# Patient Record
Sex: Female | Born: 1965 | Race: White | Hispanic: No | Marital: Married | Smoking: Never smoker
Health system: Southern US, Community
[De-identification: ages and names within clinical notes are randomized; demographics above are authoritative.]

## PROBLEM LIST (undated history)

## (undated) DIAGNOSIS — C73 Malignant neoplasm of thyroid gland: Secondary | ICD-10-CM

## (undated) DIAGNOSIS — R51 Headache: Secondary | ICD-10-CM

## (undated) DIAGNOSIS — Z9889 Other specified postprocedural states: Secondary | ICD-10-CM

## (undated) DIAGNOSIS — R232 Flushing: Secondary | ICD-10-CM

## (undated) DIAGNOSIS — B019 Varicella without complication: Secondary | ICD-10-CM

## (undated) DIAGNOSIS — E785 Hyperlipidemia, unspecified: Secondary | ICD-10-CM

## (undated) DIAGNOSIS — Z90710 Acquired absence of both cervix and uterus: Secondary | ICD-10-CM

## (undated) DIAGNOSIS — I10 Essential (primary) hypertension: Secondary | ICD-10-CM

## (undated) DIAGNOSIS — E039 Hypothyroidism, unspecified: Secondary | ICD-10-CM

## (undated) DIAGNOSIS — D649 Anemia, unspecified: Secondary | ICD-10-CM

## (undated) DIAGNOSIS — R519 Headache, unspecified: Secondary | ICD-10-CM

## (undated) DIAGNOSIS — R112 Nausea with vomiting, unspecified: Secondary | ICD-10-CM

## (undated) DIAGNOSIS — N390 Urinary tract infection, site not specified: Secondary | ICD-10-CM

## (undated) HISTORY — DX: Varicella without complication: B01.9

## (undated) HISTORY — DX: Hyperlipidemia, unspecified: E78.5

## (undated) HISTORY — DX: Essential (primary) hypertension: I10

## (undated) HISTORY — DX: Urinary tract infection, site not specified: N39.0

## (undated) HISTORY — DX: Malignant neoplasm of thyroid gland: C73

## (undated) HISTORY — PX: HERNIA REPAIR: SHX51

## (undated) HISTORY — DX: Headache, unspecified: R51.9

## (undated) HISTORY — PX: SCALP REDUCTION: SHX2377

## (undated) HISTORY — PX: COLONOSCOPY: SHX174

## (undated) HISTORY — DX: Headache: R51

---

## 1992-10-27 DIAGNOSIS — C73 Malignant neoplasm of thyroid gland: Secondary | ICD-10-CM

## 1992-10-27 HISTORY — DX: Malignant neoplasm of thyroid gland: C73

## 1993-10-27 HISTORY — PX: THYROIDECTOMY: SHX17

## 2004-10-27 HISTORY — PX: UMBILICAL HERNIA REPAIR: SUR1181

## 2013-09-26 ENCOUNTER — Encounter: Payer: Self-pay | Admitting: Family Medicine

## 2013-09-26 ENCOUNTER — Ambulatory Visit (INDEPENDENT_AMBULATORY_CARE_PROVIDER_SITE_OTHER): Payer: BC Managed Care – PPO | Admitting: Family Medicine

## 2013-09-26 VITALS — BP 122/86 | Temp 98.1°F | Ht 67.0 in | Wt 150.0 lb

## 2013-09-26 DIAGNOSIS — Z7689 Persons encountering health services in other specified circumstances: Secondary | ICD-10-CM

## 2013-09-26 DIAGNOSIS — Z85850 Personal history of malignant neoplasm of thyroid: Secondary | ICD-10-CM | POA: Insufficient documentation

## 2013-09-26 DIAGNOSIS — Z7189 Other specified counseling: Secondary | ICD-10-CM

## 2013-09-26 DIAGNOSIS — E039 Hypothyroidism, unspecified: Secondary | ICD-10-CM

## 2013-09-26 DIAGNOSIS — Z23 Encounter for immunization: Secondary | ICD-10-CM

## 2013-09-26 NOTE — Patient Instructions (Signed)
-  PLEASE SIGN UP FOR MYCHART TODAY   We recommend the following healthy lifestyle measures: - eat a healthy diet consisting of lots of vegetables, fruits, beans, nuts, seeds, healthy meats such as white chicken and fish and whole grains.  - avoid fried foods, fast food, processed foods, sodas, red meet and other fattening foods.  - get a least 150 minutes of aerobic exercise per week.   Follow up in: 4-6 months or at your convenience for yearly physical

## 2013-09-26 NOTE — Progress Notes (Signed)
Chief Complaint  Patient presents with  . Establish Care    HPI:  Chelsea Frye is here to establish care. Recently moved here. Last PCP and physical: physical march utd on paps and mammos  Has the following chronic problems and concerns today:  Hx of Thyroid Cancer: -has always been followed by an endocrinologist and wants referral to endocrinologist ere -her endocrinologist advised Dr. Jilda Panda  Patient Active Problem List   Diagnosis Date Noted  . Unspecified hypothyroidism 09/26/2013  . History of thyroid cancer 09/26/2013    Health Maintenance: -tdap today -utd on flu  ROS: See pertinent positives and negatives per HPI.  Past Medical History  Diagnosis Date  . Thyroid cancer   . Chicken pox   . Frequent headaches   . UTI (urinary tract infection)     Family History  Problem Relation Age of Onset  . Hyperlipidemia Father   . Hypertension Mother   . Hyperlipidemia Brother   . Hypertension Brother   . Diabetes Maternal Grandmother   . Diabetes Maternal Aunt     History   Social History  . Marital Status: Married    Spouse Name: N/A    Number of Children: N/A  . Years of Education: N/A   Social History Main Topics  . Smoking status: Never Smoker   . Smokeless tobacco: None  . Alcohol Use: Yes     Comment: 1 to 2 glasses of wine weekly   . Drug Use: None  . Sexual Activity: None   Other Topics Concern  . 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  GENERAL: vitals reviewed and listed above, alert, oriented, appears well hydrated and in no acute distress  MS: moves all extremities without noticeable abnormality  PSYCH: pleasant and cooperative, no obvious depression or anxiety  ASSESSMENT AND PLAN:  Discussed the following assessment and plan:  Need for prophylactic vaccination with combined diphtheria-tetanus-pertussis (DTP) vaccine - Plan: Tdap vaccine greater than or equal to 7yo IM  History of thyroid cancer - Plan: Ambulatory referral to Endocrinology, CANCELED: Ambulatory referral to Endocrinology  Unspecified hypothyroidism - Plan: Ambulatory referral to Endocrinology, CANCELED: Ambulatory referral to Endocrinology  Encounter to establish care -We reviewed the PMH, PSH, FH, SH, Meds and Allergies. -We provided refills for any medications we will prescribe as needed. -We addressed current concerns per orders and patient instructions. -We have asked for records for pertinent exams, studies, vaccines and notes from previous providers. -We have advised patient to follow up per instructions below.   -Patient advised to return or notify a doctor immediately if symptoms worsen or persist or new concerns arise.  Patient Instructions  -PLEASE SIGN UP FOR MYCHART TODAY   We recommend the following healthy lifestyle measures: - eat a healthy diet consisting of lots of vegetables, fruits, beans, nuts, seeds, healthy meats such as white  chicken and fish and whole grains.  - avoid fried foods, fast food, processed foods, sodas, red meet and other fattening foods.  - get a least 150 minutes of aerobic exercise per week.   Follow up in: 4-6 months or at your convenience for yearly physical      Terressa Koyanagi.

## 2013-10-03 ENCOUNTER — Encounter: Payer: Self-pay | Admitting: Family Medicine

## 2013-12-23 NOTE — Progress Notes (Signed)
Received office notes from Dr. Elyse Hsu from visit on 12/19/13. Pt to continue Synthroid 200 mg and future lab date.  Yearly follow up.

## 2014-01-20 ENCOUNTER — Telehealth: Payer: Self-pay

## 2014-01-20 NOTE — Telephone Encounter (Signed)
Called and spoke with pt and pt states it started 1 year ago and it is starting to affect her job.  It has gotten worse over time.  Pt states she speaks a lot and it is causing a problem.  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  Pt is aware of appt on 4/1 at 8:45 am.

## 2014-01-20 NOTE — Telephone Encounter (Signed)
Received a request from patient requesting an appointment for increased social anxiety- shaky hand, red face, racing heartbeat.  Called pt to get more information.

## 2014-01-25 ENCOUNTER — Encounter: Payer: Self-pay | Admitting: Family Medicine

## 2014-01-25 ENCOUNTER — Ambulatory Visit (INDEPENDENT_AMBULATORY_CARE_PROVIDER_SITE_OTHER): Payer: BC Managed Care – PPO | Admitting: Family Medicine

## 2014-01-25 VITALS — BP 122/90 | Temp 98.7°F | Wt 152.0 lb

## 2014-01-25 DIAGNOSIS — R232 Flushing: Secondary | ICD-10-CM

## 2014-01-25 DIAGNOSIS — N951 Menopausal and female climacteric states: Secondary | ICD-10-CM

## 2014-01-25 DIAGNOSIS — F4010 Social phobia, unspecified: Secondary | ICD-10-CM

## 2014-01-25 MED ORDER — PAROXETINE HCL 20 MG PO TABS: 20.0000 mg | ORAL_TABLET | Freq: Every day | ORAL | Status: DC

## 2014-01-25 NOTE — Progress Notes (Signed)
Chief Complaint  Patient presents with  . social anxiety    HPI:  Anxiety: -social anxiety - worsening -gets anxious, butterflies in stomach, racing heat, sweating in social areas in conferences and work speaking  Hot flashes: -periods still regular but starting to have hot flashes -sees gyn and endocrine  ROS: See pertinent positives and negatives per HPI.  Past Medical History  Diagnosis Date  . Thyroid cancer   . Chicken pox   . Frequent headaches   . UTI (urinary tract infection)     Past Surgical History  Procedure Laterality Date  . Thyroidectomy  1995    Family History  Problem Relation Age of Onset  . Hyperlipidemia Father   . Hypertension Mother   . Hyperlipidemia Brother   . Hypertension Brother   . Diabetes Maternal Grandmother   . Diabetes Maternal Aunt     History   Social History  . Marital Status: Married    Spouse Name: N/A    Number of Children: N/A  . Years of Education: N/A   Social History Main Topics  . Smoking status: Never Smoker   . Smokeless tobacco: None  . Alcohol Use: Yes     Comment: 1 to 2 glasses of wine weekly   . Drug Use: None  . Sexual Activity: None   Other Topics Concern  . 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  GENERAL: vitals reviewed and listed above, alert, oriented, appears well hydrated and in no acute  distress  HEENT: atraumatic, conjunttiva clear, no obvious abnormalities on inspection of external nose and ears  NECK: no obvious masses on inspection  LUNGS: clear to auscultation bilaterally, no wheezes, rales or rhonchi, good air movement  CV: HRRR, no peripheral edema  MS: moves all extremities without noticeable abnormality  PSYCH: pleasant and cooperative, no obvious depression or anxiety  ASSESSMENT AND PLAN:  Discussed the following assessment and plan:  Social anxiety disorder - Plan: PARoxetine (PAXIL) 20 MG tablet  Hot flashes - Plan: PARoxetine (PAXIL) 20 MG tablet  Perimenopause - Plan: PARoxetine (PAXIL) 20 MG tablet  -discussed options and opted to try paroxetine after discussion risks/benefits -advised counseling -advised follow up 4-6 months -Patient advised to return or notify a doctor immediately if symptoms worsen or persist or new concerns arise.  Patient Instructions  -start the paroxtine daily  -consider counseling  -follow up in 1 month       Tyre Beaver R.

## 2014-01-25 NOTE — Progress Notes (Signed)
Pre visit review using our clinic review tool, if applicable. No additional management support is needed unless otherwise documented below in the visit note. 

## 2014-01-25 NOTE — Patient Instructions (Signed)
-  start the paroxtine daily  -consider counseling  -follow up in 1 month

## 2014-02-13 LAB — HM PAP SMEAR: HPV, high-risk: NEGATIVE

## 2014-03-07 ENCOUNTER — Ambulatory Visit: Payer: BC Managed Care – PPO | Admitting: Family Medicine

## 2014-03-16 ENCOUNTER — Ambulatory Visit (INDEPENDENT_AMBULATORY_CARE_PROVIDER_SITE_OTHER): Payer: BC Managed Care – PPO | Admitting: Family Medicine

## 2014-03-16 ENCOUNTER — Encounter: Payer: Self-pay | Admitting: Family Medicine

## 2014-03-16 VITALS — BP 118/78 | HR 77 | Temp 97.8°F | Ht 67.0 in | Wt 154.0 lb

## 2014-03-16 DIAGNOSIS — N951 Menopausal and female climacteric states: Secondary | ICD-10-CM

## 2014-03-16 DIAGNOSIS — F411 Generalized anxiety disorder: Secondary | ICD-10-CM

## 2014-03-16 DIAGNOSIS — R232 Flushing: Secondary | ICD-10-CM

## 2014-03-16 DIAGNOSIS — F4010 Social phobia, unspecified: Secondary | ICD-10-CM

## 2014-03-16 MED ORDER — PAROXETINE HCL 20 MG PO TABS: 20.0000 mg | ORAL_TABLET | Freq: Every day | ORAL | Status: DC

## 2014-03-16 NOTE — Progress Notes (Addendum)
No chief complaint on file.   HPI:  Follow up:  Perimenopause and GAD: -started paxil last visit -reports: she reports she is feeling great and hot flashes resolved -no further hot flashes, thoughts of self harm no hearts racing  Reports saw her endocrinologist recently for thyroid and all normal. ROS: See pertinent positives and negatives per HPI.  Past Medical History  Diagnosis Date  . Thyroid cancer   . Chicken pox   . Frequent headaches   . UTI (urinary tract infection)     Past Surgical History  Procedure Laterality Date  . Thyroidectomy  1995    Family History  Problem Relation Age of Onset  . Hyperlipidemia Father   . Hypertension Mother   . Hyperlipidemia Brother   . Hypertension Brother   . Diabetes Maternal Grandmother   . Diabetes Maternal Aunt     History   Social History  . Marital Status: Married    Spouse Name: N/A    Number of Children: N/A  . Years of Education: N/A   Social History Main Topics  . Smoking status: Never Smoker   . Smokeless tobacco: None  . Alcohol Use: Yes     Comment: 1 to 2 glasses of wine weekly   . Drug Use: None  . Sexual Activity: None   Other Topics Concern  . 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  GENERAL: vitals reviewed and listed above, alert, oriented, appears well hydrated and in no acute  distress  HEENT: atraumatic, conjunttiva clear, no obvious abnormalities on inspection of external nose and ears  NECK: no obvious masses on inspection  MS: moves all extremities without noticeable abnormality  PSYCH: pleasant and cooperative, no obvious depression or anxiety  ASSESSMENT AND PLAN:  Discussed the following assessment and plan:  GAD (generalized anxiety disorder)  Social anxiety disorder - Plan: PARoxetine (PAXIL) 20 MG tablet  Hot flashes - Plan: PARoxetine (PAXIL) 20 MG tablet  Perimenopause - Plan: PARoxetine (PAXIL) 20 MG tablet  -Patient advised to return or notify a doctor immediately if symptoms worsen or persist or new concerns arise.  There are no Patient Instructions on file for this visit.   Lucretia Kern

## 2014-03-16 NOTE — Progress Notes (Signed)
Pre visit review using our clinic review tool, if applicable. No additional management support is needed unless otherwise documented below in the visit note. 

## 2014-09-15 ENCOUNTER — Encounter: Payer: BC Managed Care – PPO | Admitting: Family Medicine

## 2014-09-15 NOTE — Progress Notes (Signed)
Error   This encounter was created in error - please disregard. 

## 2014-10-27 HISTORY — PX: INGUINAL HERNIA REPAIR: SUR1180

## 2015-01-24 ENCOUNTER — Other Ambulatory Visit: Payer: Self-pay | Admitting: Family Medicine

## 2015-04-03 ENCOUNTER — Ambulatory Visit (INDEPENDENT_AMBULATORY_CARE_PROVIDER_SITE_OTHER): Payer: BLUE CROSS/BLUE SHIELD | Admitting: Family Medicine

## 2015-04-03 ENCOUNTER — Encounter: Payer: Self-pay | Admitting: Family Medicine

## 2015-04-03 VITALS — BP 138/82 | HR 96 | Temp 97.9°F | Ht 67.0 in | Wt 165.4 lb

## 2015-04-03 DIAGNOSIS — F411 Generalized anxiety disorder: Secondary | ICD-10-CM

## 2015-04-03 MED ORDER — PAROXETINE HCL 20 MG PO TABS: 20.0000 mg | ORAL_TABLET | Freq: Every day | ORAL | Status: DC

## 2015-04-03 NOTE — Patient Instructions (Addendum)
BEFORE YOUR: -physical in 3 months - come fasting and will do labs

## 2015-04-03 NOTE — Progress Notes (Signed)
  HPI:  Follow up:  GAD/Hot flashes - perimenopausa: -on paxil for this -reports: is doing great and wants to continue her paxil -denies: depression, panic, worsening anxiety -just saw her endocrinologist recently and recently increased her thyroid medication  ROS: See pertinent positives and negatives per HPI.  Past Medical History  Diagnosis Date  . Thyroid cancer   . Chicken pox   . Frequent headaches   . UTI (urinary tract infection)     Past Surgical History  Procedure Laterality Date  . Thyroidectomy  1995    Family History  Problem Relation Age of Onset  . Hyperlipidemia Father   . Hypertension Mother   . Hyperlipidemia Brother   . Hypertension Brother   . Diabetes Maternal Grandmother   . Diabetes Maternal Aunt     History   Social History  . Marital Status: Married    Spouse Name: N/A  . Number of Children: N/A  . Years of Education: N/A   Social History Main Topics  . Smoking status: Never Smoker   . Smokeless tobacco: Not on file  . Alcohol Use: Yes     Comment: 1 to 2 glasses of wine weekly   . Drug Use: Not on file  . Sexual Activity: Not on file   Other Topics Concern  . 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  PARoxetine (PAXIL) 20 MG tablet, Take 1 tablet (20 mg total) by mouth daily., Disp: 90 tablet, Rfl: 1 .  SYNTHROID 200 MCG tablet, 6 a week, Disp: , Rfl:   EXAM:  Filed Vitals:   04/03/15 1601  BP: 138/82  Pulse: 96  Temp: 97.9 F (36.6 C)    Body mass index is 25.9 kg/(m^2).  GENERAL: vitals reviewed and listed above, alert, oriented, appears well hydrated and in no acute  distress  HEENT: atraumatic, conjunttiva clear, no obvious abnormalities on inspection of external nose and ears  NECK: no obvious masses on inspection  LUNGS: clear to auscultation bilaterally, no wheezes, rales or rhonchi, good air movement  CV: HRRR, no peripheral edema  MS: moves all extremities without noticeable abnormality  PSYCH: pleasant and cooperative, no obvious depression or anxiety  ASSESSMENT AND PLAN:  Discussed the following assessment and plan:  GAD (generalized anxiety disorder) - Plan: PARoxetine (PAXIL) 20 MG tablet  -Patient advised to return or notify a doctor immediately if symptoms worsen or persist or new concerns arise.  Patient Instructions  BEFORE YOUR: -physical in 3 months - come fasting and will do labs       KIM, [place]

## 2015-04-03 NOTE — Progress Notes (Signed)
Pre visit review using our clinic review tool, if applicable. No additional management support is needed unless otherwise documented below in the visit note. 

## 2015-07-27 ENCOUNTER — Ambulatory Visit (INDEPENDENT_AMBULATORY_CARE_PROVIDER_SITE_OTHER): Payer: BLUE CROSS/BLUE SHIELD | Admitting: Family Medicine

## 2015-07-27 ENCOUNTER — Encounter: Payer: Self-pay | Admitting: Family Medicine

## 2015-07-27 VITALS — BP 136/96 | HR 104 | Temp 97.4°F | Ht 66.5 in | Wt 166.3 lb

## 2015-07-27 DIAGNOSIS — N951 Menopausal and female climacteric states: Secondary | ICD-10-CM | POA: Diagnosis not present

## 2015-07-27 DIAGNOSIS — Z0000 Encounter for general adult medical examination without abnormal findings: Secondary | ICD-10-CM

## 2015-07-27 DIAGNOSIS — I10 Essential (primary) hypertension: Secondary | ICD-10-CM

## 2015-07-27 DIAGNOSIS — R232 Flushing: Secondary | ICD-10-CM | POA: Insufficient documentation

## 2015-07-27 DIAGNOSIS — F411 Generalized anxiety disorder: Secondary | ICD-10-CM

## 2015-07-27 DIAGNOSIS — E038 Other specified hypothyroidism: Secondary | ICD-10-CM | POA: Diagnosis not present

## 2015-07-27 DIAGNOSIS — Z1211 Encounter for screening for malignant neoplasm of colon: Secondary | ICD-10-CM

## 2015-07-27 LAB — LIPID PANEL
CHOLESTEROL: 213 mg/dL — AB (ref 0–200)
HDL: 72.1 mg/dL (ref >39.00)
LDL CALC: 121 mg/dL — AB (ref 0–99)
NonHDL: 140.83
TRIGLYCERIDES: 97 mg/dL (ref 0.0–149.0)
Total CHOL/HDL Ratio: 3
VLDL: 19.4 mg/dL (ref 0.0–40.0)

## 2015-07-27 LAB — BASIC METABOLIC PANEL
BUN: 16 mg/dL (ref 6–23)
CHLORIDE: 103 meq/L (ref 96–112)
CO2: 28 meq/L (ref 19–32)
CREATININE: 0.81 mg/dL (ref 0.40–1.20)
Calcium: 9.4 mg/dL (ref 8.4–10.5)
GFR: 79.45 mL/min (ref >60.00)
Glucose, Bld: 97 mg/dL (ref 70–99)
Potassium: 4.2 mEq/L (ref 3.5–5.1)
Sodium: 139 mEq/L (ref 135–145)

## 2015-07-27 LAB — HEMOGLOBIN A1C: Hgb A1c MFr Bld: 5.5 % (ref 4.6–6.5)

## 2015-07-27 MED ORDER — HYDROCHLOROTHIAZIDE 25 MG PO TABS: 25.0000 mg | ORAL_TABLET | Freq: Every day | ORAL | Status: DC

## 2015-07-27 NOTE — Patient Instructions (Signed)
BEFORE YOU LEAVE: -labs -schedule follow up visit in 1 month  START the new medication (HCTZ/hydrochlorthiazide) for your blood pressure.  -We have ordered labs or studies at this visit. It can take up to 1-2 weeks for results and processing. We will contact you with instructions IF your results are abnormal. Normal results will be released to your [facility]. If you have not heard from [place] or can not find your results in [facility] in 2 weeks please contact our office.  -We placed a referral for you as discussed for your colonoscopy. It usually takes about 1-2 weeks to process and schedule this referral. If you have not heard from [place] regarding this appointment in 2 weeks please contact our office.  We recommend the following healthy lifestyle measures: - eat a healthy whole foods diet consisting of regular small meals composed of vegetables, fruits, beans, nuts, seeds, healthy meats such as white chicken and fish and whole grains.  - avoid sweets, white starchy foods, fried foods, fast food, processed foods, sodas, red meet and other fattening foods.  - get a least 150-300 minutes of aerobic exercise per week.

## 2015-07-27 NOTE — Progress Notes (Signed)
Pre visit review using our clinic review tool, if applicable. No additional management support is needed unless otherwise documented below in the visit note. 

## 2015-07-27 NOTE — Progress Notes (Signed)
HPI:  Here for CPE:  -Concerns and/or follow up today:   Elevated Blood Pressure: -reports intermittent at doctor visits for several years -strong FH of hypertension -denies: CP, SOB, DOE, HA  -Diet: variety of foods, balance and well rounded, larger portion sizes  -Exercise: no regular exercise  -Taking folic acid, vitamin D or calcium: no  -Diabetes and Dyslipidemia Screening: FASTING today  -Hx of HTN: no  -Vaccines: UTD  -pap history: does with gyn  -sexual activity: yes, female partner, no new partners  -wants STI testing (Hep C if born [birth year]): no  -FH breast, colon or ovarian ca: see FH Last mammogram: does with gyn yearly Last colon cancer screening: due  -Alcohol, Tobacco, drug use: see social history  Review of Systems - no fevers, unintentional weight loss, vision loss, hearing loss, chest pain, sob, hemoptysis, melena, hematochezia, hematuria, genital discharge, changing or concerning skin lesions, bleeding, bruising, loc, thoughts of self harm or SI  Past Medical History  Diagnosis Date  . Thyroid cancer   . Chicken pox   . Frequent headaches   . UTI (urinary tract infection)     Past Surgical History  Procedure Laterality Date  . Thyroidectomy  1995    Family History  Problem Relation Age of Onset  . Hyperlipidemia Father   . Hypertension Mother   . Hyperlipidemia Brother   . Hypertension Brother   . Diabetes Maternal Grandmother   . Diabetes Maternal Aunt     Social History   Social History  . Marital Status: Married    Spouse Name: N/A  . Number of Children: N/A  . Years of Education: N/A   Social History Main Topics  . Smoking status: Never Smoker   . Smokeless tobacco: None  . Alcohol Use: Yes     Comment: 1 to 2 glasses of wine weekly   . Drug Use: None  . Sexual Activity: Not Asked   Other Topics Concern  . 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  PARoxetine (PAXIL) 20 MG tablet, Take 1 tablet (20 mg total) by mouth daily., Disp: 90 tablet, Rfl: 1 .  SYNTHROID 200 MCG tablet, 6 a week, Disp: , Rfl:  .  hydrochlorothiazide (HYDRODIURIL) 25 MG tablet, Take 1 tablet (25 mg total) by mouth daily., Disp: 30 tablet, Rfl: 3  EXAM:  Filed Vitals:   07/27/15 0937  BP: 136/96  Pulse: 104  Temp: 97.4 F (36.3 C)    GENERAL: vitals reviewed and listed below, alert, oriented, appears well hydrated and in no acute distress  HEENT: head atraumatic, PERRLA, normal appearance of eyes, ears, nose and mouth. moist mucus membranes.  NECK: supple, no masses or lymphadenopathy  LUNGS: clear to auscultation bilaterally, no rales, rhonchi or wheeze  CV: HRRR, no peripheral edema or cyanosis, normal pedal pulses  BREAST: normal appearance - no lesions or discharge, on palpation normal breast tissue without any suspicious masses  ABDOMEN: bowel sounds normal, soft, non tender to palpation, no masses, no rebound or guarding  GU: normal appearance  of external genitalia - no lesions or masses, normal vaginal mucosa - no abnormal discharge, normal appearance of cervix - no lesions or abnormal discharge, no masses or tenderness on palpation of uterus and ovaries.  RECTAL: refused  SKIN: no rash or abnormal lesions  MS: normal gait, moves all extremities normally  NEURO: CN II-XII grossly intact, normal muscle strength and sensation to light touch on extremities  PSYCH: normal affect, pleasant and cooperative  ASSESSMENT AND PLAN:  Discussed the following assessment and plan:  Visit for preventive health examination - Plan: Hemoglobin A1c, Lipid Panel  Other specified  hypothyroidism  Hot flashes  GAD (generalized anxiety disorder)  Essential hypertension - Plan: Basic metabolic panel  Colon cancer screening - Plan: Ambulatory referral to Gastroenterology  -BP higher on recheck, discussed and she opted to start diuretic for this. Labs. F/u in 1 month.  -Discussed and advised all [place] preventive services health task force level A and B recommendations for age, sex and risks.  -Advised at least 150 minutes of exercise per week and a healthy diet low in saturated fats and sweets and consisting of fresh fruits and vegetables, lean meats such as fish and white chicken and whole grains.  -FASTING labs, studies and vaccines per orders this encounter  Orders Placed This Encounter  Procedures  . Basic metabolic panel  . Hemoglobin A1c  . Lipid Panel  . Ambulatory referral to Gastroenterology    Referral Priority:  Routine    Referral Type:  Consultation    Referral Reason:  Specialty Services Required    Number of Visits Requested:  1    Patient advised to return to clinic immediately if symptoms worsen or persist or new concerns.  Patient Instructions  BEFORE YOU LEAVE: -labs -schedule follow up visit in 1 month  START the new medication (HCTZ/hydrochlorthiazide) for your blood pressure.  -We have ordered labs or studies at this visit. It can take up to 1-2 weeks for results and processing. We will contact you with instructions IF your results are abnormal. Normal results will be released to your [facility]. If you have not heard from [place] or can not find your results in [facility] in 2 weeks please contact our office.  -We placed a referral for you as discussed for your colonoscopy. It usually takes about 1-2 weeks to process and schedule this referral. If you have not heard from [place] regarding this appointment in 2 weeks please contact our office.  We recommend the following healthy lifestyle measures: - eat a healthy whole foods diet consisting of  regular small meals composed of vegetables, fruits, beans, nuts, seeds, healthy meats such as white chicken and fish and whole grains.  - avoid sweets, white starchy foods, fried foods, fast food, processed foods, sodas, red meet and other fattening foods.  - get a least 150-300 minutes of aerobic exercise per week.       No Follow-up on file.  Colin Benton R.

## 2015-08-27 ENCOUNTER — Ambulatory Visit (INDEPENDENT_AMBULATORY_CARE_PROVIDER_SITE_OTHER): Payer: BLUE CROSS/BLUE SHIELD | Admitting: Family Medicine

## 2015-08-27 ENCOUNTER — Encounter: Payer: Self-pay | Admitting: Family Medicine

## 2015-08-27 VITALS — BP 120/84 | HR 97 | Temp 98.1°F | Ht 66.5 in | Wt 168.1 lb

## 2015-08-27 DIAGNOSIS — I10 Essential (primary) hypertension: Secondary | ICD-10-CM

## 2015-08-27 HISTORY — DX: Essential (primary) hypertension: I10

## 2015-08-27 MED ORDER — HYDROCHLOROTHIAZIDE 25 MG PO TABS: 25.0000 mg | ORAL_TABLET | Freq: Every day | ORAL | Status: DC

## 2015-08-27 NOTE — Patient Instructions (Signed)
BEFORE YOU LEAVE: -schedule follow up in 6 months  We recommend the following healthy lifestyle measures: - eat a healthy whole foods diet consisting of regular small meals composed of vegetables, fruits, beans, nuts, seeds, healthy meats such as white chicken and fish and whole grains.  - avoid sweets, white starchy foods, fried foods, fast food, processed foods, sodas, red meet and other fattening foods.  - get a least 150-300 minutes of aerobic exercise per week.   

## 2015-08-27 NOTE — Progress Notes (Signed)
  HPI:  Follow up:  Hypertension: -started hctz 06/2015 -reports: doing great, tolerating medication well -denies: CP, SOB, DOE   ROS: See pertinent positives and negatives per HPI.  Past Medical History  Diagnosis Date  . Thyroid cancer ([place])     hypothyroidism, followed by endocrine  . Chicken pox   . Frequent headaches   . UTI (urinary tract infection)   . Essential hypertension 08/27/2015    Past Surgical History  Procedure Laterality Date  . Thyroidectomy  1995    Family History  Problem Relation Age of Onset  . Hyperlipidemia Father   . Hypertension Mother   . Hyperlipidemia Brother   . Hypertension Brother   . Diabetes Maternal Grandmother   . Diabetes Maternal Aunt     Social History   Social History  . Marital Status: Married    Spouse Name: N/A  . Number of Children: N/A  . Years of Education: N/A   Social History Main Topics  . Smoking status: Never Smoker   . Smokeless tobacco: None  . Alcohol Use: Yes     Comment: 1 to 2 glasses of wine weekly   . Drug Use: None  . Sexual Activity: Not Asked   Other Topics Concern  . 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  hydrochlorothiazide (HYDRODIURIL) 25 MG tablet, Take 1 tablet (25 mg total) by mouth daily., Disp: 90 tablet, Rfl: 3 .  Multiple Vitamins-Minerals (ONE-A-DAY WOMENS 50 PLUS PO), Take by mouth., Disp: , Rfl:  .  PARoxetine (PAXIL) 20 MG tablet, Take 1 tablet (20 mg total) by mouth daily., Disp: 90 tablet, Rfl: 1 .  SYNTHROID 200 MCG tablet, 6 a week, Disp: , Rfl:   EXAM:  Filed Vitals:   08/27/15 1452  BP: 120/84  Pulse: 97  Temp: 98.1 F (36.7 C)    Body mass index is 26.73 kg/(m^2).  GENERAL: vitals reviewed and listed above,  alert, oriented, appears well hydrated and in no acute distress  HEENT: atraumatic, conjunttiva clear, no obvious abnormalities on inspection of external nose and ears  NECK: no obvious masses on inspection  LUNGS: clear to auscultation bilaterally, no wheezes, rales or rhonchi, good air movement  CV: HRRR, no peripheral edema  MS: moves all extremities without noticeable abnormality  PSYCH: pleasant and cooperative, no obvious depression or anxiety  ASSESSMENT AND PLAN:  Discussed the following assessment and plan:  Essential hypertension  -doing great -continue hctz -reports she has colonoscopy set up -Patient advised to return or notify a doctor immediately if symptoms worsen or persist or new concerns arise.  Patient Instructions  BEFORE YOU LEAVE -schedule follow up in 6 months  We recommend the following healthy lifestyle measures: - eat a healthy whole foods diet consisting of regular small meals composed of vegetables, fruits, beans, nuts, seeds, healthy meats such as white chicken and fish and whole grains.  - avoid sweets, white starchy foods, fried foods, fast food, processed foods, sodas, red meet and other fattening foods.  - get a least 150-300 minutes of aerobic exercise per week.       Colin Benton R.

## 2015-08-27 NOTE — Progress Notes (Signed)
Pre visit review using our clinic review tool, if applicable. No additional management support is needed unless otherwise documented below in the visit note. 

## 2015-10-19 ENCOUNTER — Other Ambulatory Visit: Payer: Self-pay | Admitting: Family Medicine

## 2015-10-19 NOTE — Telephone Encounter (Signed)
Medication requesting refill: Paroxetine (Paxil) 20 mg -- Okay to refill?   Last seen: 08/27/2015 Last refill: 04/03/2015 #90 with 1 refill

## 2015-10-28 HISTORY — PX: COLONOSCOPY: SHX174

## 2015-11-06 ENCOUNTER — Encounter: Payer: Self-pay | Admitting: Internal Medicine

## 2015-12-24 ENCOUNTER — Ambulatory Visit (AMBULATORY_SURGERY_CENTER): Payer: Self-pay | Admitting: *Deleted

## 2015-12-24 VITALS — Ht 67.0 in | Wt 167.0 lb

## 2015-12-24 DIAGNOSIS — Z1211 Encounter for screening for malignant neoplasm of colon: Secondary | ICD-10-CM

## 2015-12-24 MED ORDER — NA SULFATE-K SULFATE-MG SULF 17.5-3.13-1.6 GM/177ML PO SOLN: ORAL | Status: DC

## 2015-12-24 NOTE — Progress Notes (Signed)
Patient denies any allergies to eggs or soy. Patient denies any problems with anesthesia/sedation. Patient denies any oxygen use at home and does not take any diet/weight loss medications. Patient declined EMMI education. 

## 2016-01-07 ENCOUNTER — Ambulatory Visit (AMBULATORY_SURGERY_CENTER): Payer: BLUE CROSS/BLUE SHIELD | Admitting: Internal Medicine

## 2016-01-07 ENCOUNTER — Encounter: Payer: Self-pay | Admitting: Internal Medicine

## 2016-01-07 VITALS — BP 128/82 | HR 58 | Temp 97.8°F | Resp 13 | Ht 67.0 in | Wt 167.0 lb

## 2016-01-07 DIAGNOSIS — K635 Polyp of colon: Secondary | ICD-10-CM | POA: Diagnosis not present

## 2016-01-07 DIAGNOSIS — D123 Benign neoplasm of transverse colon: Secondary | ICD-10-CM | POA: Diagnosis not present

## 2016-01-07 DIAGNOSIS — Z1211 Encounter for screening for malignant neoplasm of colon: Secondary | ICD-10-CM

## 2016-01-07 DIAGNOSIS — D125 Benign neoplasm of sigmoid colon: Secondary | ICD-10-CM

## 2016-01-07 MED ORDER — SODIUM CHLORIDE 0.9 % IV SOLN: 500.0000 mL | INTRAVENOUS | Status: DC

## 2016-01-07 NOTE — Progress Notes (Signed)
Called to room to assist during endoscopic procedure.  Patient ID and intended procedure confirmed with present staff. Received instructions for my participation in the procedure from the performing physician.  

## 2016-01-07 NOTE — Op Note (Signed)
[facility] Patient Name: Chelsea Frye Procedure Date: 01/07/2016 10:06 AM MRN: [id number] Endoscopist: Jerene Bears , MD Age: 50 Referring MD:  Date of Birth: February 02, 1966 Gender: Female Procedure:                Colonoscopy Indications:              Screening for colorectal malignant neoplasm, This                            is the patient's first colonoscopy Medicines:                Monitored Anesthesia Care Procedure:                Pre-Anesthesia Assessment:                           - Prior to the procedure, a History and Physical                            was performed, and patient medications and                            allergies were reviewed. The patient's tolerance of                            previous anesthesia was also reviewed. The risks                            and benefits of the procedure and the sedation                            options and risks were discussed with the patient.                            All questions were answered, and informed consent                            was obtained. Prior Anticoagulants: The patient has                            taken no previous anticoagulant or antiplatelet                            agents. ASA Grade Assessment: II - A patient with                            mild systemic disease. After reviewing the risks                            and benefits, the patient was deemed in                            satisfactory condition to undergo the procedure.  After obtaining informed consent, the colonoscope                            was passed under direct vision. Throughout the                            procedure, the patient's blood pressure, pulse, and                            oxygen saturations were monitored continuously. The                            Model PCF-H190L [id number]) scope was introduced                            through the anus with the intention of advancing  to                            the cecum. The scope was advanced to the hepatic                            flexure before the procedure was aborted.                            Medications were given. The colonoscopy was aborted                            due to significant looping and a tortuous colon.                            Changing the patient to a supine position,                            straightening and shortening the scope to obtain                            bowel loop reduction and applying abdominal                            pressure did not allow for the successful                            completion of the procedure. Examined portions of                            the colon were photographed. The quality of the                            bowel preparation was good. The quality of the                            bowel preparation was [Prep Quality]. Scope In: 10:13:40 AM Scope Out: 10:38:45 AM Scope Withdrawal Time: 0 hours 4  minutes 59 seconds  Total Procedure Duration: 0 hours 25 minutes 5 seconds  Findings:      The digital rectal exam was normal.      A 5 mm polyp was found in the proximal transverse colon. The polyp was       sessile. The polyp was removed with a cold snare. Resection and       retrieval were complete.      A 3 mm polyp was found in the sigmoid colon. The polyp was sessile. The       polyp was removed with a cold snare. Resection and retrieval were       complete.      The left colon was tortuous. Advancing the scope required changing the       patient to a supine position.      Internal hemorrhoids were found during retroflexion. The hemorrhoids       were small. Complications:            No immediate complications. Estimated Blood Loss:     Estimated blood loss: none. Impression:               - The procedure was aborted due to significant                            looping and a tortuous colon.                           - One 5 mm polyp in  the proximal transverse colon,                            removed with a cold snare. Resected and retrieved.                           - One 3 mm polyp in the sigmoid colon, removed with                            a cold snare. Resected and retrieved.                           - Tortuous colon leading to incomplete examination                            (cecum and ascending colon not visualized).                           - Internal hemorrhoids. Recommendation:           - Patient has a contact number available for                            emergencies. The signs and symptoms of potential                            delayed complications were discussed with the                            patient. Return to normal activities tomorrow.  Written discharge instructions were provided to the                            patient.                           - Resume previous diet.                           - Continue present medications.                           - Await pathology results.                           - Perform a virtual colonoscopy at appointment to                            be scheduled. Procedure Code(s):        --- Professional ---                           [phone], 52, Colonoscopy, flexible; with removal of                            tumor(s), polyp(s), or other lesion(s) by snare                            technique CPT copyright 2016 American Medical Association. All rights reserved. Lajuan Lines. Hilarie Fredrickson, MD Jerene Bears, MD 01/07/2016 10:46:16 AM This report has been signed electronically. Number of Addenda: 0

## 2016-01-07 NOTE — Progress Notes (Signed)
A/ox3, pleased with MAC, report to RN 

## 2016-01-07 NOTE — Patient Instructions (Addendum)
YOU HAD AN ENDOSCOPIC PROCEDURE TODAY AT [facility]:   Refer to the procedure report that was given to you for any specific questions about what was found during the examination.  If the procedure report does not answer your questions, please call your gastroenterologist to clarify.  If you requested that your care partner not be given the details of your procedure findings, then the procedure report has been included in a sealed envelope for you to review at your convenience later.  YOU SHOULD EXPECT: Some feelings of bloating in the abdomen. Passage of more gas than usual.  Walking can help get rid of the air that was put into your GI tract during the procedure and reduce the bloating. If you had a lower endoscopy (such as a colonoscopy or flexible sigmoidoscopy) you may notice spotting of blood in your stool or on the toilet paper. If you underwent a bowel prep for your procedure, you may not have a normal bowel movement for a few days.  Please Note:  You might notice some irritation and congestion in your nose or some drainage.  This is from the oxygen used during your procedure.  There is no need for concern and it should clear up in a day or so.  SYMPTOMS TO REPORT IMMEDIATELY:   Following lower endoscopy (colonoscopy or flexible sigmoidoscopy):  Excessive amounts of blood in the stool  Significant tenderness or worsening of abdominal pains  Swelling of the abdomen that is new, acute  Fever of 100F or higher   For urgent or emergent issues, a gastroenterologist can be reached at any hour by calling [phone].   DIET: Your first meal following the procedure should be a small meal and then it is ok to progress to your normal diet. Heavy or fried foods are harder to digest and may make you feel nauseous or bloated.  Likewise, meals heavy in dairy and vegetables can increase bloating.  Drink plenty of fluids but you should avoid alcoholic beverages for 24  hours.  ACTIVITY:  You should plan to take it easy for the rest of today and you should NOT DRIVE or use heavy machinery until tomorrow (because of the sedation medicines used during the test).    FOLLOW UP: Our staff will call the number listed on your records the next business day following your procedure to check on you and address any questions or concerns that you may have regarding the information given to you following your procedure. If we do not reach you, we will leave a message.  However, if you are feeling well and you are not experiencing any problems, there is no need to return our call.  We will assume that you have returned to your regular daily activities without incident.  If any biopsies were taken you will be contacted by phone or by letter within the next 1-3 weeks.  Please call us at [phone] if you have not heard about the biopsies in 3 weeks.    SIGNATURES/CONFIDENTIALITY: You and/or your care partner have signed paperwork which will be entered into your electronic medical record.  These signatures attest to the fact that that the information above on your After Visit Summary has been reviewed and is understood.  Full responsibility of the confidentiality of this discharge information lies with you and/or your care-partner.  Polyps, hemorrhoids-handouts given  Virtual colonoscopy appointment to be scheduled by office, they should call you in the next couple of days.

## 2016-01-08 ENCOUNTER — Telehealth: Payer: Self-pay | Admitting: *Deleted

## 2016-01-08 NOTE — Telephone Encounter (Signed)
Message left

## 2016-01-15 ENCOUNTER — Encounter: Payer: Self-pay | Admitting: Internal Medicine

## 2016-01-15 ENCOUNTER — Telehealth: Payer: Self-pay

## 2016-01-15 ENCOUNTER — Other Ambulatory Visit: Payer: Self-pay

## 2016-01-15 DIAGNOSIS — Q438 Other specified congenital malformations of intestine: Secondary | ICD-10-CM

## 2016-01-15 NOTE — Telephone Encounter (Signed)
Pt scheduled for virtual colon with [facility] at [address] 01/24/16@8am . Pt to be NPO after midnight and arrive at the office at 7:40am. Pt to pick up the prep from their office this week. Pt aware.

## 2016-01-18 ENCOUNTER — Other Ambulatory Visit: Payer: Self-pay | Admitting: Family Medicine

## 2016-01-24 ENCOUNTER — Inpatient Hospital Stay: Admission: RE | Admit: 2016-01-24 | Payer: BLUE CROSS/BLUE SHIELD | Source: Ambulatory Visit

## 2016-02-06 ENCOUNTER — Ambulatory Visit
Admission: RE | Admit: 2016-02-06 | Discharge: 2016-02-06 | Disposition: A | Discharge status: Home / Self Care | Payer: BLUE CROSS/BLUE SHIELD | Source: Ambulatory Visit | Attending: Internal Medicine | Admitting: Internal Medicine

## 2016-02-06 DIAGNOSIS — Q438 Other specified congenital malformations of intestine: Secondary | ICD-10-CM

## 2016-04-17 ENCOUNTER — Other Ambulatory Visit: Payer: Self-pay | Admitting: Family Medicine

## 2016-06-17 ENCOUNTER — Encounter: Payer: Self-pay | Admitting: Family Medicine

## 2016-07-04 ENCOUNTER — Ambulatory Visit: Payer: BLUE CROSS/BLUE SHIELD | Admitting: Family Medicine

## 2016-07-11 ENCOUNTER — Ambulatory Visit (INDEPENDENT_AMBULATORY_CARE_PROVIDER_SITE_OTHER): Payer: BLUE CROSS/BLUE SHIELD | Admitting: Family Medicine

## 2016-07-11 ENCOUNTER — Encounter: Payer: Self-pay | Admitting: Family Medicine

## 2016-07-11 VITALS — BP 118/78 | HR 89 | Temp 98.1°F | Ht 67.0 in | Wt 168.1 lb

## 2016-07-11 DIAGNOSIS — K4090 Unilateral inguinal hernia, without obstruction or gangrene, not specified as recurrent: Secondary | ICD-10-CM

## 2016-07-11 NOTE — Progress Notes (Signed)
Pre visit review using our clinic review tool, if applicable. No additional management support is needed unless otherwise documented below in the visit note. 

## 2016-07-11 NOTE — Progress Notes (Signed)
  HPI:  Acute visit for:  Hernia: -L groin region -hx repair of same on the R -reports does a lot of heavy lifting/yard work -notices bulge, sometimes fairly large and mildly uncomfortable in L groin region that she can reduce and then sometimes doe snot recur for some time -she wants referral to surgeon for repair as is annoying and uncomfortable when happens  ROS: See pertinent positives and negatives per HPI.  Past Medical History:  Diagnosis Date  . Chicken pox   . Essential hypertension 08/27/2015  . Frequent headaches   . Thyroid cancer ([place]) 20 years ago   hypothyroidism, followed by endocrine  . UTI (urinary tract infection)     Past Surgical History:  Procedure Laterality Date  . THYROIDECTOMY  1995    Family History  Problem Relation Age of Onset  . Hyperlipidemia Father   . Hypertension Mother   . Hyperlipidemia Brother   . Hypertension Brother   . Diabetes Maternal Grandmother   . Diabetes Maternal Aunt   . Colon cancer Neg Hx     Social History   Social History  . Marital status: Married    Spouse name: N/A  . Number of children: N/A  . Years of education: N/A   Social History Main Topics  . Smoking status: Never Smoker  . Smokeless tobacco: Never Used  . Alcohol use Yes     Comment: 1 to 2 glasses of wine weekly   . Drug use: Unknown  . Sexual activity: Not Asked   Other Topics Concern  . 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  hydrochlorothiazide (HYDRODIURIL) 25 MG tablet, Take 1 tablet (25 mg total) by mouth daily., Disp: 90 tablet, Rfl: 3 .  Multiple Vitamins-Minerals (ONE-A-DAY WOMENS 50 PLUS PO), Take by mouth., Disp: , Rfl:  .  PARoxetine (PAXIL) 20 MG tablet, TAKE 1 TABLET DAILY  (NEED APPOINTMENT), Disp: 90 tablet, Rfl: 0 .  SYNTHROID 200 MCG tablet, 6 a week, Disp: , Rfl:   EXAM:  Vitals:   07/11/16 1619  BP: 118/78  Pulse: 89  Temp: 98.1 F (36.7 C)    Body mass index is 26.33 kg/m.  GENERAL: vitals reviewed and listed above, alert, oriented, appears well hydrated and in no acute distress  HEENT: atraumatic, conjunttiva clear, no obvious abnormalities on inspection of external nose and ears  NECK: no obvious masses on inspection  LUNGS: clear to auscultation bilaterally, no wheezes, rales or rhonchi, good air movement  CV: HRRR, no peripheral edema  ABD: BS+, soft, NTTP, ? Mild defect bulge LLQ  MS: moves all extremities without noticeable abnormality  PSYCH: pleasant and cooperative, no obvious depression or anxiety  ASSESSMENT AND PLAN:  Discussed the following assessment and plan:  Unilateral inguinal hernia without obstruction or gangrene, recurrence not specified  -referral per pt request -discussed potential complication and emergency precuations -Patient advised to return or notify a doctor immediately if symptoms worsen or persist or new concerns arise.  There are no Patient Instructions on file for this visit.  Colin Benton R., DO

## 2016-07-17 ENCOUNTER — Other Ambulatory Visit: Payer: Self-pay | Admitting: Family Medicine

## 2016-07-17 ENCOUNTER — Other Ambulatory Visit: Payer: Self-pay | Admitting: *Deleted

## 2016-07-17 MED ORDER — PAROXETINE HCL 20 MG PO TABS: ORAL_TABLET | ORAL | 0 refills | Status: DC

## 2016-07-17 NOTE — Telephone Encounter (Signed)
Initial refill request from mail order pharmacy was denied as the pt needs an appt.  A 30-day supply was sent to the pts local pharmacy.

## 2016-07-25 ENCOUNTER — Encounter: Payer: Self-pay | Admitting: Family Medicine

## 2016-07-25 ENCOUNTER — Ambulatory Visit (INDEPENDENT_AMBULATORY_CARE_PROVIDER_SITE_OTHER): Payer: BLUE CROSS/BLUE SHIELD | Admitting: Family Medicine

## 2016-07-25 VITALS — BP 108/80 | HR 71 | Temp 97.8°F | Ht 67.0 in | Wt 164.9 lb

## 2016-07-25 DIAGNOSIS — E785 Hyperlipidemia, unspecified: Secondary | ICD-10-CM | POA: Diagnosis not present

## 2016-07-25 DIAGNOSIS — I10 Essential (primary) hypertension: Secondary | ICD-10-CM

## 2016-07-25 DIAGNOSIS — N951 Menopausal and female climacteric states: Secondary | ICD-10-CM | POA: Diagnosis not present

## 2016-07-25 DIAGNOSIS — R232 Flushing: Secondary | ICD-10-CM

## 2016-07-25 MED ORDER — HYDROCHLOROTHIAZIDE 25 MG PO TABS: 25.0000 mg | ORAL_TABLET | Freq: Every day | ORAL | 3 refills | Status: DC

## 2016-07-25 MED ORDER — PAROXETINE HCL 20 MG PO TABS: 20.0000 mg | ORAL_TABLET | Freq: Every day | ORAL | 3 refills | Status: DC

## 2016-07-25 NOTE — Progress Notes (Signed)
Pre visit review using our clinic review tool, if applicable. No additional management support is needed unless otherwise documented below in the visit note. 

## 2016-07-25 NOTE — Progress Notes (Signed)
  HPI:  Follow up:  HTN: -doing well on the hctz -trying to eat healthy and stay active -no cp, sob, doe  Hot flashes/anxiety: -prozac working great, wants to continue -denies depression, anxiety, persistent hot flashes  HLD: -due for repeat labs -will do non-fasting cholesterol ratio  Sees endo for thyroid checks.  ROS: See pertinent positives and negatives per HPI.  Past Medical History:  Diagnosis Date  . Chicken pox   . Essential hypertension 08/27/2015  . Frequent headaches   . Thyroid cancer ([place]) 20 years ago   hypothyroidism, followed by endocrine  . UTI (urinary tract infection)     Past Surgical History:  Procedure Laterality Date  . THYROIDECTOMY  1995    Family History  Problem Relation Age of Onset  . Hyperlipidemia Father   . Hypertension Mother   . Hyperlipidemia Brother   . Hypertension Brother   . Diabetes Maternal Grandmother   . Diabetes Maternal Aunt   . Colon cancer Neg Hx     Social History   Social History  . Marital status: Married    Spouse name: N/A  . Number of children: N/A  . Years of education: N/A   Social History Main Topics  . Smoking status: Never Smoker  . Smokeless tobacco: Never Used  . Alcohol use Yes     Comment: 1 to 2 glasses of wine weekly   . Drug use: Unknown  . Sexual activity: Not Asked   Other Topics Concern  . 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  hydrochlorothiazide (HYDRODIURIL) 25 MG tablet, Take 1 tablet (25 mg total) by mouth daily., Disp: 90 tablet, Rfl: 3 .  Multiple Vitamins-Minerals (ONE-A-DAY WOMENS 50 PLUS PO), Take by mouth., Disp: , Rfl:  .  PARoxetine (PAXIL) 20 MG tablet, Take 1 tablet (20 mg total) by mouth daily., Disp: 90  tablet, Rfl: 3 .  SYNTHROID 200 MCG tablet, 6 a week, Disp: , Rfl:   EXAM:  Vitals:   07/25/16 1618  BP: 108/80  Pulse: 71  Temp: 97.8 F (36.6 C)    Body mass index is 25.83 kg/m.  GENERAL: vitals reviewed and listed above, alert, oriented, appears well hydrated and in no acute distress  HEENT: atraumatic, conjunttiva clear, no obvious abnormalities on inspection of external nose and ears  NECK: no obvious masses on inspection  LUNGS: clear to auscultation bilaterally, no wheezes, rales or rhonchi, good air movement  CV: HRRR, no peripheral edema  MS: moves all extremities without noticeable abnormality  PSYCH: pleasant and cooperative, no obvious depression or anxiety  ASSESSMENT AND PLAN:  Discussed the following assessment and plan:  Essential hypertension - Plan: Basic metabolic panel, CBC (no diff)  Hot flashes  Hyperlipemia - Plan: Cholesterol, Total, HDL cholesterol  -labs -declined flu shot as gets at job -lifestyle recs - once recovered from hernia surgery she will plan to start exercising more -meds refilled -Patient advised to return or notify a doctor immediately if symptoms worsen or persist or new concerns arise.  There are no Patient Instructions on file for this visit.  Colin Benton R., DO

## 2016-07-25 NOTE — Patient Instructions (Signed)
BEFORE YOU LEAVE: -follow up: Physical/Preventive care visit in 3-4 months -labs  Refills sent!  We have ordered labs or studies at this visit. It can take up to 1-2 weeks for results and processing. IF results require follow up or explanation, we will call you with instructions. Clinically stable results will be released to your [facility]. If you have not heard from [place] or cannot find your results in [facility] in 2 weeks please contact our office at [phone].  If you are not yet signed up for [facility], please consider signing up.  We recommend the following healthy lifestyle for LIFE: 1) Small portions.   Tip: eat off of a salad plate instead of a dinner plate.  Tip: It is ok to feel hungry after a meal - that likely means you ate an appropriate portion.  Tip: if you need more or a snack choose fruits, veggies and/or a handful of nuts or seeds.  2) Eat a healthy clean diet.  * Tip: Avoid (less then 1 serving per week): processed foods, sweets, sweetened drinks, white starches (rice, flour, bread, potatoes, pasta, etc), red meat, fast foods, butter  *Tip: CHOOSE instead   * 5-9 servings per day of fresh or frozen fruits and vegetables (but not corn, potatoes, bananas, canned or dried fruit)   *nuts and seeds, beans   *olives and olive oil   *small portions of lean meats such as fish and white chicken    *small portions of whole grains  3)Get at least 150 minutes of sweaty aerobic exercise per week.  4)Reduce stress - consider counseling, meditation and relaxation to balance other aspects of your life.

## 2016-07-26 LAB — BASIC METABOLIC PANEL
BUN: 15 mg/dL (ref 7–25)
CO2: 27 mmol/L (ref 20–31)
CREATININE: 0.82 mg/dL (ref 0.50–1.05)
Calcium: 9.4 mg/dL (ref 8.6–10.4)
Chloride: 101 mmol/L (ref 98–110)
GLUCOSE: 82 mg/dL (ref 65–99)
Potassium: 3.8 mmol/L (ref 3.5–5.3)
Sodium: 138 mmol/L (ref 135–146)

## 2016-07-26 LAB — CBC
HCT: 38.3 % (ref 35.0–45.0)
Hemoglobin: 12.9 g/dL (ref 11.7–15.5)
MCH: 29.8 pg (ref 27.0–33.0)
MCHC: 33.7 g/dL (ref 32.0–36.0)
MCV: 88.5 fL (ref 80.0–100.0)
MPV: 9.9 fL (ref 7.5–12.5)
PLATELETS: 350 10*3/uL (ref 140–400)
RBC: 4.33 MIL/uL (ref 3.80–5.10)
RDW: 14.3 % (ref 11.0–15.0)
WBC: 6.6 10*3/uL (ref 3.8–10.8)

## 2016-07-26 LAB — HDL CHOLESTEROL: HDL: 64 mg/dL (ref >=46)

## 2016-07-26 LAB — CHOLESTEROL, TOTAL: CHOLESTEROL: 211 mg/dL — AB (ref 125–200)

## 2016-07-28 ENCOUNTER — Other Ambulatory Visit: Payer: Self-pay | Admitting: *Deleted

## 2016-07-28 MED ORDER — PAROXETINE HCL 20 MG PO TABS: 20.0000 mg | ORAL_TABLET | Freq: Every day | ORAL | 3 refills | Status: DC

## 2016-07-28 NOTE — Telephone Encounter (Signed)
Rx done. 

## 2016-08-03 ENCOUNTER — Other Ambulatory Visit: Payer: Self-pay | Admitting: Family Medicine

## 2016-09-08 ENCOUNTER — Encounter: Payer: Self-pay | Admitting: Family Medicine

## 2016-11-10 ENCOUNTER — Ambulatory Visit (INDEPENDENT_AMBULATORY_CARE_PROVIDER_SITE_OTHER): Payer: BLUE CROSS/BLUE SHIELD | Admitting: Family Medicine

## 2016-11-10 ENCOUNTER — Encounter: Payer: Self-pay | Admitting: Family Medicine

## 2016-11-10 ENCOUNTER — Telehealth: Payer: Self-pay | Admitting: *Deleted

## 2016-11-10 ENCOUNTER — Ambulatory Visit (INDEPENDENT_AMBULATORY_CARE_PROVIDER_SITE_OTHER)
Admission: RE | Admit: 2016-11-10 | Discharge: 2016-11-10 | Disposition: A | Discharge status: Home / Self Care | Payer: BLUE CROSS/BLUE SHIELD | Source: Ambulatory Visit | Attending: Family Medicine | Admitting: Family Medicine

## 2016-11-10 VITALS — BP 102/70 | HR 85 | Temp 97.4°F | Ht 67.0 in | Wt 173.0 lb

## 2016-11-10 DIAGNOSIS — R1909 Other intra-abdominal and pelvic swelling, mass and lump: Secondary | ICD-10-CM

## 2016-11-10 LAB — BASIC METABOLIC PANEL
BUN: 13 mg/dL (ref 6–23)
CHLORIDE: 100 meq/L (ref 96–112)
CO2: 28 meq/L (ref 19–32)
Calcium: 9.4 mg/dL (ref 8.4–10.5)
Creatinine, Ser: 0.92 mg/dL (ref 0.40–1.20)
GFR: 68.24 mL/min (ref >60.00)
GLUCOSE: 92 mg/dL (ref 70–99)
POTASSIUM: 3.8 meq/L (ref 3.5–5.1)
SODIUM: 137 meq/L (ref 135–145)

## 2016-11-10 NOTE — Progress Notes (Addendum)
HPI:  Chelsea Frye is a pleasant 51 yo here for an acute visit for a "bulge" in her stomach around the belly button. She noticed this several months ago but thought it was related to her hernia so she reports she did not pursue evaluation. She saw the surgeon and had an inguinal hernia repair but this did not help the bulge and she feels it is gettin larger. No abd or pelvic pain, nausea, vomiting, change in bowels, malaise, fevers, wt loss or bleeding. FDLMP 3 weeks ago. Period regular without intramenstrual spotting. Does admit to heavy menstrual periods with clotting.  ROS: See pertinent positives and negatives per HPI.  Past Medical History:  Diagnosis Date  . Chicken pox   . Essential hypertension 08/27/2015  . Frequent headaches   . Thyroid cancer ([place]) 20 years ago   hypothyroidism, followed by endocrine  . UTI (urinary tract infection)     Past Surgical History:  Procedure Laterality Date  . THYROIDECTOMY  1995    Family History  Problem Relation Age of Onset  . Hyperlipidemia Father   . Hypertension Mother   . Hyperlipidemia Brother   . Hypertension Brother   . Diabetes Maternal Grandmother   . Diabetes Maternal Aunt   . Colon cancer Neg Hx     Social History   Social History  . Marital status: Married    Spouse name: N/A  . Number of children: N/A  . Years of education: N/A   Social History Main Topics  . Smoking status: Never Smoker  . Smokeless tobacco: Never Used  . Alcohol use Yes     Comment: 1 to 2 glasses of wine weekly   . Drug use: Unknown  . Sexual activity: Not Asked   Other Topics Concern  . 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  hydrochlorothiazide (HYDRODIURIL) 25 MG tablet, Take 1  tablet (25 mg total) by mouth daily., Disp: 90 tablet, Rfl: 3 .  Multiple Vitamins-Minerals (ONE-A-DAY WOMENS 50 PLUS PO), Take by mouth., Disp: , Rfl:  .  PARoxetine (PAXIL) 20 MG tablet, Take 1 tablet (20 mg total) by mouth daily., Disp: 90 tablet, Rfl: 3 .  SYNTHROID 200 MCG tablet, 6 a week, Disp: , Rfl:   EXAM:  Vitals:   11/10/16 1400  BP: 102/70  Pulse: 85  Temp: 97.4 F (36.3 C)    Body mass index is 27.1 kg/m.  GENERAL: vitals reviewed and listed above, alert, oriented, appears well hydrated and in no acute distress  HEENT: atraumatic, conjunttiva clear, no obvious abnormalities on inspection of external nose and ears  NECK: no obvious masses on inspection  LUNGS: clear to auscultation bilaterally, no wheezes, rales or rhonchi, good air movement  CV: HRRR, no peripheral edema  ABD: grapefruit sized mass in the lower mid abdomen, firm, sl mobile  MS: moves all extremities without noticeable abnormality  PSYCH: pleasant and cooperative, no obvious depression or anxiety  ASSESSMENT AND PLAN:  Discussed the following assessment and plan:  Abdominal mass of other site - Plan: Basic metabolic panel, CT Abdomen Pelvis W Contrast  -we discussed possible serious and likely etiologies,  workup and treatment, treatment risks and return precautions, suspect fibroid uterus most likely -STAT Ct to evaluate -Patient advised to return or notify a doctor immediately if symptoms worsen or persist or new concerns arise.  Patient Instructions  BEFORE YOU LEAVE: -follow up: 1 month -details of appointment for STAT CT scan    Colin Benton R., DO

## 2016-11-10 NOTE — Telephone Encounter (Signed)
[name] from CT called to let Dr Maudie Mercury know the CT report is available and does show an enlarged fibroid.  Message forwarded to Dr Maudie Mercury.

## 2016-11-10 NOTE — Progress Notes (Signed)
Pre visit review using our clinic review tool, if applicable. No additional management support is needed unless otherwise documented below in the visit note. 

## 2016-11-10 NOTE — Patient Instructions (Signed)
BEFORE YOU LEAVE: -follow up: 1 month -details of appointment for STAT CT scan

## 2017-01-19 ENCOUNTER — Ambulatory Visit: Admit: 2017-01-19 | Payer: BLUE CROSS/BLUE SHIELD | Admitting: Obstetrics and Gynecology

## 2017-01-19 SURGERY — HYSTERECTOMY, TOTAL, LAPAROSCOPIC
Anesthesia: General

## 2017-04-19 ENCOUNTER — Other Ambulatory Visit: Payer: Self-pay | Admitting: Family Medicine

## 2017-06-05 NOTE — Patient Instructions (Addendum)
Your procedure is scheduled on:  Wednesday, Aug. 22, 2018  Enter through the [employer] of [facility] at:  6:00 AM  Pick up the phone at the desk and dial [phone].  Call this number if you have problems the morning of surgery: [phone].  Remember: Do NOT eat food or drink after:  Midnight Tuesday  Take these medicines the morning of surgery with a SIP OF WATER:   Paroxetine, Synthroid  Stop ALL herbal medications at this time  Do NOT smoke the day of surgery.  Do NOT wear jewelry (body piercing), metal hair clips/bobby pins, make-up, artifical eyelashes or nail polish. Do NOT wear lotions, powders, or perfumes.  You may wear deodorant. Do NOT shave for 48 hours prior to surgery. Do NOT bring valuables to the hospital. Contacts, dentures, or bridgework may not be worn into surgery.  Leave suitcase in car.  After surgery it may be brought to your room.  For patients admitted to the hospital, checkout time is 11:00 AM the day of discharge.  Bring a copy of your healthcare power of attorney and living will documents.

## 2017-06-08 ENCOUNTER — Inpatient Hospital Stay (HOSPITAL_COMMUNITY)
Admission: RE | Admit: 2017-06-08 | Discharge: 2017-06-08 | Disposition: A | Discharge status: Home / Self Care | Payer: BLUE CROSS/BLUE SHIELD | Source: Ambulatory Visit

## 2017-06-08 ENCOUNTER — Encounter (HOSPITAL_COMMUNITY): Payer: Self-pay

## 2017-06-09 NOTE — Patient Instructions (Signed)
Your procedure is scheduled on:  Wednesday, Aug. 22, 2018  Enter through the [employer] of [facility] at:  6:00 AM  Pick up the phone at the desk and dial [phone].  Call this number if you have problems the morning of surgery: [phone].  Remember: Do NOT eat food or drink after:  Midnight Tuesday  Take these medicines the morning of surgery with a SIP OF WATER:  Hydrochlorothiazide, Paroxetine, Synthroid  Stop ALL herbal medications at this time  Do NOT smoke the day of surgery.  Do NOT wear jewelry (body piercing), metal hair clips/bobby pins, make-up, artifical eyelashes or nail polish. Do NOT wear lotions, powders, or perfumes.  You may wear deodorant. Do NOT shave for 48 hours prior to surgery. Do NOT bring valuables to the hospital. Contacts, dentures, or bridgework may not be worn into surgery.  Leave suitcase in car.  After surgery it may be brought to your room.  For patients admitted to the hospital, checkout time is 11:00 AM the day of discharge.  Bring a copy of your healthcare power of attorney and living will documents.

## 2017-06-10 ENCOUNTER — Encounter (HOSPITAL_COMMUNITY): Payer: Self-pay

## 2017-06-10 ENCOUNTER — Other Ambulatory Visit: Payer: Self-pay

## 2017-06-10 ENCOUNTER — Encounter (HOSPITAL_COMMUNITY)
Admission: RE | Admit: 2017-06-10 | Discharge: 2017-06-10 | Disposition: A | Discharge status: Home / Self Care | Payer: BLUE CROSS/BLUE SHIELD | Source: Ambulatory Visit | Attending: Obstetrics and Gynecology | Admitting: Obstetrics and Gynecology

## 2017-06-10 DIAGNOSIS — Z01812 Encounter for preprocedural laboratory examination: Secondary | ICD-10-CM | POA: Insufficient documentation

## 2017-06-10 DIAGNOSIS — Z0183 Encounter for blood typing: Secondary | ICD-10-CM | POA: Insufficient documentation

## 2017-06-10 DIAGNOSIS — D259 Leiomyoma of uterus, unspecified: Secondary | ICD-10-CM | POA: Insufficient documentation

## 2017-06-10 DIAGNOSIS — Z01818 Encounter for other preprocedural examination: Secondary | ICD-10-CM | POA: Diagnosis not present

## 2017-06-10 HISTORY — DX: Other specified postprocedural states: Z98.890

## 2017-06-10 HISTORY — DX: Hypothyroidism, unspecified: E03.9

## 2017-06-10 HISTORY — DX: Flushing: R23.2

## 2017-06-10 HISTORY — DX: Anemia, unspecified: D64.9

## 2017-06-10 HISTORY — DX: Nausea with vomiting, unspecified: R11.2

## 2017-06-10 LAB — COMPREHENSIVE METABOLIC PANEL
ALBUMIN: 4.4 g/dL (ref 3.5–5.0)
ALT: 26 U/L (ref 14–54)
AST: 26 U/L (ref 15–41)
Alkaline Phosphatase: 61 U/L (ref 38–126)
Anion gap: 9 (ref 5–15)
BUN: 14 mg/dL (ref 6–20)
CHLORIDE: 99 mmol/L — AB (ref 101–111)
CO2: 29 mmol/L (ref 22–32)
CREATININE: 0.89 mg/dL (ref 0.44–1.00)
Calcium: 9.8 mg/dL (ref 8.9–10.3)
GFR calc Af Amer: >60 mL/min (ref >60)
GLUCOSE: 98 mg/dL (ref 65–99)
Potassium: 3.7 mmol/L (ref 3.5–5.1)
SODIUM: 137 mmol/L (ref 135–145)
Total Bilirubin: 0.8 mg/dL (ref 0.3–1.2)
Total Protein: 7.5 g/dL (ref 6.5–8.1)

## 2017-06-10 LAB — TYPE AND SCREEN
ABO/RH(D): O POS
Antibody Screen: NEGATIVE

## 2017-06-10 LAB — CBC
HCT: 39.8 % (ref 36.0–46.0)
Hemoglobin: 13.8 g/dL (ref 12.0–15.0)
MCH: 30.2 pg (ref 26.0–34.0)
MCHC: 34.7 g/dL (ref 30.0–36.0)
MCV: 87.1 fL (ref 78.0–100.0)
PLATELETS: 266 10*3/uL (ref 150–400)
RBC: 4.57 MIL/uL (ref 3.87–5.11)
RDW: 13.4 % (ref 11.5–15.5)
WBC: 4.1 10*3/uL (ref 4.0–10.5)

## 2017-06-10 LAB — ABO/RH: ABO/RH(D): O POS

## 2017-06-16 NOTE — H&P (Signed)
Brynda Heick is an 51 y.o. female [phone] with fibroid uterus for TLH, B salpingectomy.  Also has received several doses of Depot-Lupron to decrease uterine size.  Verified by uterine US.  History of thyroid cancer - s/p thyroidectomy.  Also HTN and depression.    Pertinent Gynecological History: OB History: G4 P3013 SCD x 3, 7#2-7#10 No abn pap  Last 02/13/14,HR HPV neg   Menstrual History:  No LMP recorded. Patient is not currently having periods (Reason: Other).    Past Medical History:  Diagnosis Date  . Anemia    history of  . Chicken pox   . Essential hypertension 08/27/2015  . Frequent headaches    Migraines  . Hot flashes   . Hypothyroidism   . PONV (postoperative nausea and vomiting)   . Thyroid cancer ([place]) 20 years ago   hypothyroidism, followed by endocrine  . UTI (urinary tract infection)     Past Surgical History:  Procedure Laterality Date  . COLONOSCOPY    . HERNIA REPAIR    . SCALP REDUCTION     less than 64 years old  . THYROIDECTOMY  1995    Family History  Problem Relation Age of Onset  . Hyperlipidemia Father   . Hypertension Mother   . Hyperlipidemia Brother   . Hypertension Brother   . Diabetes Maternal Grandmother   . Diabetes Maternal Aunt   . Colon cancer Neg Hx     Social History:  reports that she has never smoked. She has never used smokeless tobacco. She reports that she drinks alcohol. She reports that she does not use drugs.married, Mudlogger - Manufacturing engineer  Allergies: No Known Allergies  Meds: HCTZ, Depot-Lupron, Paxil 20mg , Synthroid 271mcg    Review of Systems  Constitutional: Negative.   HENT: Negative.   Eyes: Negative.   Respiratory: Negative.   Cardiovascular: Negative.   Gastrointestinal: Negative.   Genitourinary: Negative.   Musculoskeletal: Negative.   Skin: Negative.   Neurological: Negative.   Psychiatric/Behavioral: Negative.     There were no vitals taken for this visit. Physical Exam   Constitutional: She is oriented to person, place, and time. She appears well-developed and well-nourished.  HENT:  Head: Normocephalic and atraumatic.  Cardiovascular: Normal rate and regular rhythm.   Respiratory: Effort normal and breath sounds normal. No respiratory distress. She has no wheezes.  GI: Soft. Bowel sounds are normal. She exhibits no distension. There is no tenderness.  Genitourinary:  Genitourinary Comments: Fibroid uterus.    Musculoskeletal: Normal range of motion.  Neurological: She is alert and oriented to person, place, and time.  Skin: Skin is warm and dry.  Psychiatric: She has a normal mood and affect. Her behavior is normal.   CT - enlarged fibroid uterus 10x10x12cm  [place] 1/29 - large fibroids biggest 11x12x12 [place] 5/30 - multi fibroids, decreased yo 8.9 x 11, also 4.5cm   Hgb 13.8, Ur Cx neg  Assessment/Plan: 51yo with symptomatic fibroid uterus for TLH, B salpingectomy 1. Ancef for prophylaxis 2. D/w pt r/b/a of surgery, including possible TAH.    Aastha Dayley Bovard-Stuckert 06/16/2017, 4:56 PM

## 2017-06-17 ENCOUNTER — Ambulatory Visit (HOSPITAL_COMMUNITY): Payer: BLUE CROSS/BLUE SHIELD | Admitting: Anesthesiology

## 2017-06-17 ENCOUNTER — Encounter (HOSPITAL_COMMUNITY)
Admission: RE | Disposition: A | Discharge status: Home / Self Care | Payer: Self-pay | Source: Ambulatory Visit | Attending: Obstetrics and Gynecology

## 2017-06-17 ENCOUNTER — Observation Stay (HOSPITAL_COMMUNITY)
Admission: RE | Admit: 2017-06-17 | Discharge: 2017-06-18 | Disposition: A | Discharge status: Home / Self Care | Payer: BLUE CROSS/BLUE SHIELD | Source: Ambulatory Visit | Attending: Obstetrics and Gynecology | Admitting: Obstetrics and Gynecology

## 2017-06-17 ENCOUNTER — Encounter (HOSPITAL_COMMUNITY): Payer: Self-pay

## 2017-06-17 DIAGNOSIS — Z79899 Other long term (current) drug therapy: Secondary | ICD-10-CM | POA: Diagnosis not present

## 2017-06-17 DIAGNOSIS — Z7902 Long term (current) use of antithrombotics/antiplatelets: Secondary | ICD-10-CM | POA: Diagnosis not present

## 2017-06-17 DIAGNOSIS — Z85850 Personal history of malignant neoplasm of thyroid: Secondary | ICD-10-CM | POA: Insufficient documentation

## 2017-06-17 DIAGNOSIS — E039 Hypothyroidism, unspecified: Secondary | ICD-10-CM | POA: Diagnosis not present

## 2017-06-17 DIAGNOSIS — Z90710 Acquired absence of both cervix and uterus: Secondary | ICD-10-CM

## 2017-06-17 DIAGNOSIS — N800 Endometriosis of uterus: Secondary | ICD-10-CM | POA: Insufficient documentation

## 2017-06-17 DIAGNOSIS — I10 Essential (primary) hypertension: Secondary | ICD-10-CM | POA: Diagnosis not present

## 2017-06-17 DIAGNOSIS — D259 Leiomyoma of uterus, unspecified: Secondary | ICD-10-CM | POA: Diagnosis present

## 2017-06-17 DIAGNOSIS — D649 Anemia, unspecified: Secondary | ICD-10-CM | POA: Diagnosis not present

## 2017-06-17 DIAGNOSIS — N72 Inflammatory disease of cervix uteri: Secondary | ICD-10-CM | POA: Insufficient documentation

## 2017-06-17 DIAGNOSIS — D251 Intramural leiomyoma of uterus: Principal | ICD-10-CM | POA: Insufficient documentation

## 2017-06-17 HISTORY — DX: Acquired absence of both cervix and uterus: Z90.710

## 2017-06-17 HISTORY — PX: LAPAROSCOPIC HYSTERECTOMY: SHX1926

## 2017-06-17 HISTORY — PX: ABDOMINAL HYSTERECTOMY: SHX81

## 2017-06-17 LAB — PREGNANCY, URINE: Preg Test, Ur: NEGATIVE

## 2017-06-17 SURGERY — HYSTERECTOMY, TOTAL, LAPAROSCOPIC
Anesthesia: General | Site: Abdomen

## 2017-06-17 MED ORDER — ONDANSETRON HCL 4 MG/2ML IJ SOLN
INTRAMUSCULAR | Status: DC | PRN
  Administered 2017-06-17: 4 mg via INTRAVENOUS

## 2017-06-17 MED ORDER — SODIUM CHLORIDE 0.9 % IJ SOLN
INTRAMUSCULAR | Status: AC
  Filled 2017-06-17: qty 10

## 2017-06-17 MED ORDER — CEFAZOLIN SODIUM 10 G IJ SOLR
INTRAMUSCULAR | Status: AC
  Filled 2017-06-17: qty 3000

## 2017-06-17 MED ORDER — PROMETHAZINE HCL 25 MG/ML IJ SOLN: 6.2500 mg | INTRAMUSCULAR | Status: DC | PRN

## 2017-06-17 MED ORDER — SODIUM CHLORIDE 0.9% FLUSH: 9.0000 mL | INTRAVENOUS | Status: DC | PRN

## 2017-06-17 MED ORDER — EPHEDRINE SULFATE-NACL 50-0.9 MG/10ML-% IV SOSY
PREFILLED_SYRINGE | INTRAVENOUS | Status: DC | PRN
  Administered 2017-06-17 (×3): 20 mg via INTRAVENOUS

## 2017-06-17 MED ORDER — MENTHOL 3 MG MT LOZG: 1.0000 | LOZENGE | OROMUCOSAL | Status: DC | PRN

## 2017-06-17 MED ORDER — PAROXETINE HCL 20 MG PO TABS
20.0000 mg | ORAL_TABLET | Freq: Every day | ORAL | Status: DC
  Filled 2017-06-17: qty 1

## 2017-06-17 MED ORDER — OXYCODONE-ACETAMINOPHEN 5-325 MG PO TABS: 1.0000 | ORAL_TABLET | ORAL | Status: DC | PRN

## 2017-06-17 MED ORDER — LIDOCAINE HCL (CARDIAC) 20 MG/ML IV SOLN
INTRAVENOUS | Status: DC | PRN
  Administered 2017-06-17: 80 mg via INTRAVENOUS

## 2017-06-17 MED ORDER — SCOPOLAMINE 1 MG/3DAYS TD PT72
1.0000 | MEDICATED_PATCH | Freq: Once | TRANSDERMAL | Status: DC
  Administered 2017-06-17: 1.5 mg via TRANSDERMAL

## 2017-06-17 MED ORDER — DIPHENHYDRAMINE HCL 12.5 MG/5ML PO ELIX: 12.5000 mg | ORAL_SOLUTION | Freq: Four times a day (QID) | ORAL | Status: DC | PRN

## 2017-06-17 MED ORDER — PROPOFOL 10 MG/ML IV BOLUS
INTRAVENOUS | Status: DC | PRN
  Administered 2017-06-17: 180 mg via INTRAVENOUS

## 2017-06-17 MED ORDER — HYDROMORPHONE HCL 1 MG/ML IJ SOLN
INTRAMUSCULAR | Status: DC | PRN
  Administered 2017-06-17: 0.5 mg via INTRAVENOUS

## 2017-06-17 MED ORDER — HYDROMORPHONE HCL 1 MG/ML IJ SOLN
INTRAMUSCULAR | Status: AC
  Filled 2017-06-17: qty 1

## 2017-06-17 MED ORDER — PROPOFOL 10 MG/ML IV BOLUS
INTRAVENOUS | Status: AC
  Filled 2017-06-17: qty 20

## 2017-06-17 MED ORDER — SCOPOLAMINE 1 MG/3DAYS TD PT72
MEDICATED_PATCH | TRANSDERMAL | Status: AC
  Administered 2017-06-17: 1.5 mg via TRANSDERMAL
  Filled 2017-06-17: qty 1

## 2017-06-17 MED ORDER — LIDOCAINE HCL (CARDIAC) 20 MG/ML IV SOLN
INTRAVENOUS | Status: AC
  Filled 2017-06-17: qty 5

## 2017-06-17 MED ORDER — EPHEDRINE 5 MG/ML INJ
INTRAVENOUS | Status: AC
  Filled 2017-06-17: qty 10

## 2017-06-17 MED ORDER — DEXAMETHASONE SODIUM PHOSPHATE 4 MG/ML IJ SOLN
INTRAMUSCULAR | Status: AC
  Filled 2017-06-17: qty 1

## 2017-06-17 MED ORDER — LACTATED RINGERS IV SOLN: INTRAVENOUS | Status: DC

## 2017-06-17 MED ORDER — LACTATED RINGERS IV SOLN
INTRAVENOUS | Status: DC
  Administered 2017-06-17 (×2): via INTRAVENOUS

## 2017-06-17 MED ORDER — ONDANSETRON HCL 4 MG/2ML IJ SOLN: 4.0000 mg | Freq: Four times a day (QID) | INTRAMUSCULAR | Status: DC | PRN

## 2017-06-17 MED ORDER — HYDROCHLOROTHIAZIDE 25 MG PO TABS: 25.0000 mg | ORAL_TABLET | Freq: Every day | ORAL | Status: DC

## 2017-06-17 MED ORDER — FENTANYL CITRATE (PF) 100 MCG/2ML IJ SOLN
INTRAMUSCULAR | Status: DC | PRN
  Administered 2017-06-17: 50 ug via INTRAVENOUS
  Administered 2017-06-17: 100 ug via INTRAVENOUS
  Administered 2017-06-17 (×2): 50 ug via INTRAVENOUS

## 2017-06-17 MED ORDER — LACTATED RINGERS IV SOLN
INTRAVENOUS | Status: DC
  Administered 2017-06-17 (×3): via INTRAVENOUS

## 2017-06-17 MED ORDER — KETOROLAC TROMETHAMINE 30 MG/ML IJ SOLN
INTRAMUSCULAR | Status: DC | PRN
  Administered 2017-06-17: 30 mg via INTRAVENOUS

## 2017-06-17 MED ORDER — MIDAZOLAM HCL 2 MG/2ML IJ SOLN
INTRAMUSCULAR | Status: DC | PRN
  Administered 2017-06-17: 2 mg via INTRAVENOUS

## 2017-06-17 MED ORDER — IBUPROFEN 800 MG PO TABS: 800.0000 mg | ORAL_TABLET | Freq: Three times a day (TID) | ORAL | Status: DC | PRN

## 2017-06-17 MED ORDER — SODIUM CHLORIDE 0.9 % IR SOLN
Status: DC | PRN
  Administered 2017-06-17: 3000 mL

## 2017-06-17 MED ORDER — BUPIVACAINE HCL (PF) 0.25 % IJ SOLN
INTRAMUSCULAR | Status: AC
  Filled 2017-06-17: qty 30

## 2017-06-17 MED ORDER — KETOROLAC TROMETHAMINE 30 MG/ML IJ SOLN
INTRAMUSCULAR | Status: AC
  Filled 2017-06-17: qty 1

## 2017-06-17 MED ORDER — HYDROMORPHONE 1 MG/ML IV SOLN
INTRAVENOUS | Status: DC
  Administered 2017-06-17: 13:00:00 via INTRAVENOUS
  Filled 2017-06-17: qty 25

## 2017-06-17 MED ORDER — GABAPENTIN 400 MG PO CAPS
ORAL_CAPSULE | ORAL | Status: AC
  Administered 2017-06-17: 400 mg via ORAL
  Filled 2017-06-17: qty 1

## 2017-06-17 MED ORDER — SUGAMMADEX SODIUM 200 MG/2ML IV SOLN
INTRAVENOUS | Status: DC | PRN
  Administered 2017-06-17: 200 mg via INTRAVENOUS

## 2017-06-17 MED ORDER — DIPHENHYDRAMINE HCL 50 MG/ML IJ SOLN: 12.5000 mg | Freq: Four times a day (QID) | INTRAMUSCULAR | Status: DC | PRN

## 2017-06-17 MED ORDER — SIMETHICONE 80 MG PO CHEW: 80.0000 mg | CHEWABLE_TABLET | Freq: Four times a day (QID) | ORAL | Status: DC | PRN

## 2017-06-17 MED ORDER — ONDANSETRON HCL 4 MG/2ML IJ SOLN
INTRAMUSCULAR | Status: AC
  Filled 2017-06-17: qty 2

## 2017-06-17 MED ORDER — ROCURONIUM BROMIDE 100 MG/10ML IV SOLN
INTRAVENOUS | Status: AC
  Filled 2017-06-17: qty 1

## 2017-06-17 MED ORDER — NALOXONE HCL 0.4 MG/ML IJ SOLN: 0.4000 mg | INTRAMUSCULAR | Status: DC | PRN

## 2017-06-17 MED ORDER — GUAIFENESIN 100 MG/5ML PO SOLN: 15.0000 mL | ORAL | Status: DC | PRN

## 2017-06-17 MED ORDER — FENTANYL CITRATE (PF) 250 MCG/5ML IJ SOLN
INTRAMUSCULAR | Status: AC
  Filled 2017-06-17: qty 5

## 2017-06-17 MED ORDER — ONDANSETRON HCL 4 MG PO TABS: 4.0000 mg | ORAL_TABLET | Freq: Four times a day (QID) | ORAL | Status: DC | PRN

## 2017-06-17 MED ORDER — BUPIVACAINE HCL (PF) 0.25 % IJ SOLN
INTRAMUSCULAR | Status: DC | PRN
  Administered 2017-06-17: 16 mL

## 2017-06-17 MED ORDER — ROCURONIUM BROMIDE 100 MG/10ML IV SOLN
INTRAVENOUS | Status: DC | PRN
  Administered 2017-06-17: 20 mg via INTRAVENOUS
  Administered 2017-06-17: 10 mg via INTRAVENOUS
  Administered 2017-06-17: 20 mg via INTRAVENOUS
  Administered 2017-06-17 (×2): 10 mg via INTRAVENOUS
  Administered 2017-06-17: 20 mg via INTRAVENOUS

## 2017-06-17 MED ORDER — METHYLENE BLUE 0.5 % INJ SOLN
INTRAVENOUS | Status: AC
  Filled 2017-06-17: qty 10

## 2017-06-17 MED ORDER — DEXAMETHASONE SODIUM PHOSPHATE 10 MG/ML IJ SOLN
INTRAMUSCULAR | Status: AC
  Filled 2017-06-17: qty 1

## 2017-06-17 MED ORDER — LEVOTHYROXINE SODIUM 200 MCG PO TABS
200.0000 ug | ORAL_TABLET | Freq: Every day | ORAL | Status: DC
  Administered 2017-06-18: 200 ug via ORAL
  Filled 2017-06-17: qty 1

## 2017-06-17 MED ORDER — MEPERIDINE HCL 25 MG/ML IJ SOLN: 6.2500 mg | INTRAMUSCULAR | Status: DC | PRN

## 2017-06-17 MED ORDER — MIDAZOLAM HCL 2 MG/2ML IJ SOLN
INTRAMUSCULAR | Status: AC
  Filled 2017-06-17: qty 2

## 2017-06-17 MED ORDER — HYDROMORPHONE HCL 1 MG/ML IJ SOLN: 0.2500 mg | INTRAMUSCULAR | Status: DC | PRN

## 2017-06-17 MED ORDER — KETOROLAC TROMETHAMINE 30 MG/ML IJ SOLN: 30.0000 mg | Freq: Once | INTRAMUSCULAR | Status: DC | PRN

## 2017-06-17 MED ORDER — ALUM & MAG HYDROXIDE-SIMETH 200-200-20 MG/5ML PO SUSP: 30.0000 mL | ORAL | Status: DC | PRN

## 2017-06-17 MED ORDER — DEXAMETHASONE SODIUM PHOSPHATE 10 MG/ML IJ SOLN
INTRAMUSCULAR | Status: DC | PRN
  Administered 2017-06-17: 10 mg via INTRAVENOUS

## 2017-06-17 MED ORDER — CEFAZOLIN SODIUM-DEXTROSE 2-3 GM-% IV SOLR
INTRAVENOUS | Status: DC | PRN
  Administered 2017-06-17: 2 g via INTRAVENOUS

## 2017-06-17 MED ORDER — GABAPENTIN 400 MG PO CAPS
400.0000 mg | ORAL_CAPSULE | Freq: Three times a day (TID) | ORAL | Status: DC
  Administered 2017-06-17 – 2017-06-18 (×4): 400 mg via ORAL
  Filled 2017-06-17 (×10): qty 1

## 2017-06-17 SURGICAL SUPPLY — 64 items
BARRIER ADHS 3X4 INTERCEED (GAUZE/BANDAGES/DRESSINGS) IMPLANT
CABLE HIGH FREQUENCY MONO STRZ (ELECTRODE) IMPLANT
CANISTER SUCT 3000ML PPV (MISCELLANEOUS) ×4 IMPLANT
CLIP SUT LAPRA TY ABSORB (SUTURE) IMPLANT
CLOTH BEACON ORANGE TIMEOUT ST (SAFETY) ×4 IMPLANT
CONT PATH 16OZ SNAP LID 3702 (MISCELLANEOUS) ×4 IMPLANT
COVER LIGHT HANDLE  1/PK (MISCELLANEOUS)
COVER LIGHT HANDLE 1/PK (MISCELLANEOUS) IMPLANT
DECANTER SPIKE VIAL GLASS SM (MISCELLANEOUS) ×8 IMPLANT
DERMABOND ADVANCED (GAUZE/BANDAGES/DRESSINGS) ×2
DERMABOND ADVANCED .7 DNX12 (GAUZE/BANDAGES/DRESSINGS) ×2 IMPLANT
DEVICE SUTURE ENDOST 10MM (ENDOMECHANICALS) ×4 IMPLANT
DRAPE WARM FLUID 44X44 (DRAPE) IMPLANT
DRSG OPSITE POSTOP 4X10 (GAUZE/BANDAGES/DRESSINGS) ×4 IMPLANT
DURAPREP 26ML APPLICATOR (WOUND CARE) ×4 IMPLANT
ENDOSTITCH 0 SINGLE 48 (SUTURE) IMPLANT
FILTER SMOKE EVAC LAPAROSHD (FILTER) ×4 IMPLANT
GAUZE SPONGE 4X4 16PLY XRAY LF (GAUZE/BANDAGES/DRESSINGS) IMPLANT
GLOVE BIO SURGEON STRL SZ 6.5 (GLOVE) ×3 IMPLANT
GLOVE BIO SURGEONS STRL SZ 6.5 (GLOVE) ×1
GLOVE BIOGEL PI IND STRL 7.0 (GLOVE) ×4 IMPLANT
GLOVE BIOGEL PI INDICATOR 7.0 (GLOVE) ×4
GLOVE ORTHO TXT STRL SZ7.5 (GLOVE) ×4 IMPLANT
GOWN STRL REUS W/TWL LRG LVL3 (GOWN DISPOSABLE) ×12 IMPLANT
NEEDLE HYPO 22GX1.5 SAFETY (NEEDLE) IMPLANT
NS IRRIG 1000ML POUR BTL (IV SOLUTION) ×4 IMPLANT
OCCLUDER COLPOPNEUMO (BALLOONS) ×4 IMPLANT
PACK ABDOMINAL GYN (CUSTOM PROCEDURE TRAY) ×4 IMPLANT
PACK LAPAROSCOPY BASIN (CUSTOM PROCEDURE TRAY) ×4 IMPLANT
PACK TRENDGUARD 450 HYBRID PRO (MISCELLANEOUS) ×2 IMPLANT
PAD OB MATERNITY 4.3X12.25 (PERSONAL CARE ITEMS) ×4 IMPLANT
PENCIL SMOKE EVAC W/HOLSTER (ELECTROSURGICAL) ×4 IMPLANT
PROTECTOR NERVE ULNAR (MISCELLANEOUS) ×8 IMPLANT
RETRACTOR WND ALEXIS 25 LRG (MISCELLANEOUS) ×2 IMPLANT
RTRCTR WOUND ALEXIS 25CM LRG (MISCELLANEOUS) ×4
SCISSORS LAP 5X35 DISP (ENDOMECHANICALS) IMPLANT
SET CYSTO W/LG BORE CLAMP LF (SET/KITS/TRAYS/PACK) IMPLANT
SET IRRIG TUBING LAPAROSCOPIC (IRRIGATION / IRRIGATOR) ×4 IMPLANT
SHEARS HARMONIC ACE PLUS 36CM (ENDOMECHANICALS) ×4 IMPLANT
SLEEVE XCEL OPT CAN 5 100 (ENDOMECHANICALS) ×4 IMPLANT
SPONGE LAP 18X18 X RAY DECT (DISPOSABLE) ×8 IMPLANT
STAPLER VISISTAT 35W (STAPLE) ×4 IMPLANT
SUT PDS AB 0 CTX 60 (SUTURE) IMPLANT
SUT PLAIN 2 0 XLH (SUTURE) IMPLANT
SUT VIC AB 0 CT1 27 (SUTURE) ×4
SUT VIC AB 0 CT1 27XBRD ANBCTR (SUTURE) ×4 IMPLANT
SUT VIC AB 0 CT1 36 (SUTURE) IMPLANT
SUT VIC AB 1 CT1 18XBRD ANBCTR (SUTURE) ×6 IMPLANT
SUT VIC AB 1 CT1 8-18 (SUTURE) ×6
SUT VIC AB 3-0 PS2 18 (SUTURE) ×2
SUT VIC AB 3-0 PS2 18XBRD (SUTURE) ×2 IMPLANT
SUT VIC AB 3-0 SH 27 (SUTURE)
SUT VIC AB 3-0 SH 27X BRD (SUTURE) IMPLANT
SUT VICRYL 0 TIES 12 18 (SUTURE) ×4 IMPLANT
SUT VICRYL 0 UR6 27IN ABS (SUTURE) ×4 IMPLANT
SYR CONTROL 10ML LL (SYRINGE) IMPLANT
TIP UTERINE 5.1X6CM LAV DISP (MISCELLANEOUS) IMPLANT
TIP UTERINE 6.7X10CM GRN DISP (MISCELLANEOUS) IMPLANT
TIP UTERINE 6.7X6CM WHT DISP (MISCELLANEOUS) IMPLANT
TIP UTERINE 6.7X8CM BLUE DISP (MISCELLANEOUS) ×4 IMPLANT
TOWEL OR 17X24 6PK STRL BLUE (TOWEL DISPOSABLE) ×8 IMPLANT
TRAY FOLEY CATH SILVER 14FR (SET/KITS/TRAYS/PACK) ×4 IMPLANT
TRENDGUARD 450 HYBRID PRO PACK (MISCELLANEOUS) ×4
WARMER LAPAROSCOPE (MISCELLANEOUS) ×4 IMPLANT

## 2017-06-17 NOTE — OR Nursing (Signed)
Husband updated by phone by C.Chyna Kneece,RN per Dr. Melba Coon

## 2017-06-17 NOTE — Interval H&P Note (Signed)
History and Physical Interval Note:  06/17/2017 7:05 AM  Chelsea Frye  has presented today for surgery, with the diagnosis of uterine fibroids  The various methods of treatment have been discussed with the patient and family. After consideration of risks, benefits and other options for treatment, the patient has consented to  Procedure(s) with comments: [place] (N/A) - MD needs 2.5hrs OR time POSSIBLE HYSTERECTOMY ABDOMINAL (N/A) as a surgical intervention .  The patient's history has been reviewed, patient examined, no change in status, stable for surgery.  I have reviewed the patient's chart and labs.  Questions were answered to the patient's satisfaction.     Taya Ashbaugh Bovard-Stuckert

## 2017-06-17 NOTE — Progress Notes (Signed)
Day of Surgery Procedure(s) (LRB): HYSTERECTOMY TOTAL LAPAROSCOPIC (N/A) POSSIBLE HYSTERECTOMY ABDOMINAL (N/A)  Subjective: Patient reports tolerating PO.  Ambulating.  Pain controlled, hasn't needed PCA.  .    Objective: I have reviewed patient's vital signs and intake and output.  General: alert and no distress Resp: clear to auscultation bilaterally Cardio: regular rate and rhythm GI: soft, non-tender; bowel sounds normal; no masses,  no organomegaly Extremities: extremities normal, atraumatic, no cyanosis or edema  Assessment: s/p Procedure(s) with comments: HYSTERECTOMY TOTAL LAPAROSCOPIC (N/A) - MD needs 2.5hrs OR time POSSIBLE HYSTERECTOMY ABDOMINAL (N/A): stable, progressing well and tolerating diet  Plan: Advance diet Encourage ambulation Advance to PO medication Discontinue IV fluids prn   LOS: 0 days    Chelsea Frye 06/17/2017, 4:17 PM

## 2017-06-17 NOTE — Transfer of Care (Signed)
Immediate Anesthesia Transfer of Care Note  Patient: Chelsea Frye  Procedure(s) Performed: Procedure(s) with comments: HYSTERECTOMY TOTAL LAPAROSCOPIC (N/A) - MD needs 2.5hrs OR time POSSIBLE HYSTERECTOMY ABDOMINAL (N/A)  Patient Location: PACU  Anesthesia Type:General  Level of Consciousness: awake and sedated  Airway & Oxygen Therapy: Patient Spontanous Breathing  Post-op Assessment: Report given to RN  Post vital signs: Reviewed and stable  Last Vitals:  Vitals:   06/17/17 0605  BP: 117/81  Pulse: 90  Resp: 16  Temp: 36.7 C  SpO2: 100%    Last Pain:  Vitals:   06/17/17 0605  TempSrc: Oral      Patients Stated Pain Goal: 3 (00/16/42 9037)  Complications: No apparent anesthesia complications

## 2017-06-17 NOTE — OR Nursing (Signed)
Pt. Husband updated by phone per Dr. Melba Coon.

## 2017-06-17 NOTE — Anesthesia Procedure Notes (Signed)
Procedure Name: Intubation Date/Time: 06/17/2017 7:31 AM Performed by: Casimer Lanius A Pre-anesthesia Checklist: Patient identified, Emergency Drugs available, Suction available and Patient being monitored Patient Re-evaluated:Patient Re-evaluated prior to induction Oxygen Delivery Method: Circle system utilized and Simple face mask Preoxygenation: Pre-oxygenation with 100% oxygen Induction Type: IV induction Ventilation: Mask ventilation without difficulty Laryngoscope Size: Mac and 3 Grade View: Grade II Tube type: Oral Tube size: 7.0 mm Number of attempts: 1 Airway Equipment and Method: Stylet Placement Confirmation: ETT inserted through vocal cords under direct vision,  positive ETCO2 and breath sounds checked- equal and bilateral Secured at: 20 (right lip) cm Tube secured with: Tape Dental Injury: Teeth and Oropharynx as per pre-operative assessment

## 2017-06-17 NOTE — Brief Op Note (Signed)
06/17/2017  10:48 AM  PATIENT:  Georgina Quint  51 y.o. female  PRE-OPERATIVE DIAGNOSIS:  uterine fibroids  POST-OPERATIVE DIAGNOSIS:  uterine fibroids  PROCEDURE:  Procedure(s) with comments: HYSTERECTOMY TOTAL LAPAROSCOPIC (N/A) - MD needs 2.5hrs OR time POSSIBLE HYSTERECTOMY ABDOMINAL (N/A)  SURGEON:  Surgeon(s) and Role:    * Bovard-Stuckert, Ludwika Rodd, MD - Primary  Banga, Cecilia, DO   Findings: enlarged fibroid uterus, several anterior - 684g, nl ovaries and fallopian tubes  ANESTHESIA:   local and general  EBL:  Total I/O In: 1800 [I.V.:1800] Out: 350 [Urine:250; Blood:100]  BLOOD ADMINISTERED:none  DRAINS: Urinary Catheter (Foley)   LOCAL MEDICATIONS USED:  MARCAINE     SPECIMEN:  Source of Specimen:  uterus, cervix and B tubes  DISPOSITION OF SPECIMEN:  PATHOLOGY  COUNTS:  YES  TOURNIQUET:  * No tourniquets in log *  DICTATION: .Other Dictation: Dictation Number [id number]  PLAN OF CARE: Admit for overnight observation  PATIENT DISPOSITION:  PACU - hemodynamically stable.   Delay start of Pharmacological VTE agent (>24hrs) due to surgical blood loss or risk of bleeding: not applicable

## 2017-06-17 NOTE — Anesthesia Preprocedure Evaluation (Signed)
Anesthesia Evaluation  Patient identified by MRN, date of birth, ID band Patient awake    Reviewed: Allergy & Precautions, NPO status , Patient's Chart, lab work & pertinent test results  History of Anesthesia Complications (+) PONV  Airway Mallampati: I       Dental no notable dental hx. (+) Teeth Intact   Pulmonary    Pulmonary exam normal        Cardiovascular hypertension, Pt. on medications Normal cardiovascular exam Rhythm:Regular Rate:Normal     Neuro/Psych    GI/Hepatic negative GI ROS, Neg liver ROS,   Endo/Other    Renal/GU negative Renal ROS  negative genitourinary   Musculoskeletal negative musculoskeletal ROS (+)   Abdominal Normal abdominal exam  (+)   Peds  Hematology   Anesthesia Other Findings   Reproductive/Obstetrics negative OB ROS                             Anesthesia Physical Anesthesia Plan  ASA: II  Anesthesia Plan: General   Post-op Pain Management:    Induction: Intravenous  PONV Risk Score and Plan: 4 or greater and Ondansetron, Dexamethasone, Midazolam, Scopolamine patch - Pre-op and Propofol infusion  Airway Management Planned: Oral ETT  Additional Equipment:   Intra-op Plan:   Post-operative Plan: Extubation in OR  Informed Consent: I have reviewed the patients History and Physical, chart, labs and discussed the procedure including the risks, benefits and alternatives for the proposed anesthesia with the patient or authorized representative who has indicated his/her understanding and acceptance.   Dental advisory given  Plan Discussed with: CRNA and Surgeon  Anesthesia Plan Comments:         Anesthesia Quick Evaluation

## 2017-06-18 ENCOUNTER — Encounter (HOSPITAL_COMMUNITY): Payer: Self-pay | Admitting: Obstetrics and Gynecology

## 2017-06-18 DIAGNOSIS — D259 Leiomyoma of uterus, unspecified: Secondary | ICD-10-CM | POA: Diagnosis not present

## 2017-06-18 LAB — CBC
HEMATOCRIT: 29.1 % — AB (ref 36.0–46.0)
HEMOGLOBIN: 10.3 g/dL — AB (ref 12.0–15.0)
MCH: 31.1 pg (ref 26.0–34.0)
MCHC: 35.4 g/dL (ref 30.0–36.0)
MCV: 87.9 fL (ref 78.0–100.0)
Platelets: 195 10*3/uL (ref 150–400)
RBC: 3.31 MIL/uL — ABNORMAL LOW (ref 3.87–5.11)
RDW: 13.4 % (ref 11.5–15.5)
WBC: 4.8 10*3/uL (ref 4.0–10.5)

## 2017-06-18 LAB — BASIC METABOLIC PANEL
Anion gap: 8 (ref 5–15)
BUN: 8 mg/dL (ref 6–20)
CHLORIDE: 104 mmol/L (ref 101–111)
CO2: 29 mmol/L (ref 22–32)
Calcium: 8.4 mg/dL — ABNORMAL LOW (ref 8.9–10.3)
Creatinine, Ser: 0.77 mg/dL (ref 0.44–1.00)
GFR calc Af Amer: >60 mL/min (ref >60)
GFR calc non Af Amer: >60 mL/min (ref >60)
Glucose, Bld: 97 mg/dL (ref 65–99)
Potassium: 3.3 mmol/L — ABNORMAL LOW (ref 3.5–5.1)
Sodium: 141 mmol/L (ref 135–145)

## 2017-06-18 MED ORDER — GABAPENTIN 400 MG PO CAPS: 400.0000 mg | ORAL_CAPSULE | Freq: Three times a day (TID) | ORAL | 0 refills | Status: DC

## 2017-06-18 MED ORDER — IBUPROFEN 800 MG PO TABS: 800.0000 mg | ORAL_TABLET | Freq: Three times a day (TID) | ORAL | 1 refills | Status: DC | PRN

## 2017-06-18 MED ORDER — OXYCODONE-ACETAMINOPHEN 5-325 MG PO TABS: 1.0000 | ORAL_TABLET | ORAL | 0 refills | Status: DC | PRN

## 2017-06-18 NOTE — Anesthesia Postprocedure Evaluation (Signed)
Anesthesia Post Note  Patient: Dejana Pugsley  Procedure(s) Performed: Procedure(s) (LRB): HYSTERECTOMY TOTAL LAPAROSCOPIC (N/A) POSSIBLE HYSTERECTOMY ABDOMINAL (N/A)     Patient location during evaluation: PACU Anesthesia Type: General Level of consciousness: awake and sedated Pain management: pain level controlled Vital Signs Assessment: post-procedure vital signs reviewed and stable Respiratory status: spontaneous breathing Cardiovascular status: stable Postop Assessment: no signs of nausea or vomiting Anesthetic complications: no    Last Vitals:  Vitals:   06/18/17 0419 06/18/17 0800  BP: 110/63 121/64  Pulse: 84 76  Resp: 18 18  Temp: 36.7 C 36.5 C  SpO2: 98% 97%    Last Pain:  Vitals:   06/18/17 0800  TempSrc: Oral  PainSc:    Pain Goal: Patients Stated Pain Goal: 3 (06/17/17 1200)               Elza Varricchio JR,JOHN Mateo Flow

## 2017-06-18 NOTE — Progress Notes (Signed)
Pt discharged with printed instructions. Pt verbalized an understanding. No concerns noted. Nycholas Rayner L Keldric Poyer, RN 

## 2017-06-18 NOTE — Discharge Summary (Signed)
Physician Discharge Summary  Patient ID: Chelsea Frye MRN: [id number] DOB/AGE: 1966-09-29 51 y.o.  Admit date: 06/17/2017 Discharge date: 06/18/2017  Admission Diagnoses: fibroid uterus  Discharge Diagnoses:  Principal Problem:   S/P laparoscopic hysterectomy   Discharged Condition: good  Hospital Course: admitted 8/22 underwent surgery with out complication.  Laparoscopic hysterectomy.  Postoperative course uncomplicated, d/c to home POD#1 ambulating, voiding and tolerating po.    Consults: None  Significant Diagnostic Studies: labs: CBC, BMP  Treatments: analgesia: PCA and percocet, surgery - TLH, B salpingectomy  Discharge Exam: Blood pressure 110/63, pulse 84, temperature 98 F (36.7 C), temperature source Oral, resp. rate 18, height 5\' 7"  (1.702 m), weight 75.8 kg (167 lb), SpO2 98 %. General appearance: alert and no distress Resp: clear to auscultation bilaterally Cardio: regular rate and rhythm GI: soft, non-tender; bowel sounds normal; no masses,  no organomegaly Extremities: extremities normal, atraumatic, no cyanosis or edema Incision/Wound:C/D/I  Disposition: Final discharge disposition not confirmed  Discharge Instructions    Call MD for:  persistant nausea and vomiting    Complete by:  As directed    Call MD for:  redness, tenderness, or signs of infection (pain, swelling, redness, odor or green/yellow discharge around incision site)    Complete by:  As directed    Call MD for:  severe uncontrolled pain    Complete by:  As directed    Diet - low sodium heart healthy    Complete by:  As directed    Discharge instructions    Complete by:  As directed    Call [phone] with questions or problems   Driving Restrictions    Complete by:  As directed    While taking strong pain medicine   Increase activity slowly    Complete by:  As directed    Lifting restrictions    Complete by:  As directed    No greater than 10-15lbs for 2-3 weeks   May shower /  Bathe    Complete by:  As directed    May walk up steps    Complete by:  As directed    Sexual Activity Restrictions    Complete by:  As directed    Pelvic rest - no douching, tampons or sex for 6 weeks     Allergies as of 06/18/2017   No Known Allergies     Medication List    TAKE these medications   gabapentin 400 MG capsule Commonly known as:  NEURONTIN Take 1 capsule (400 mg total) by mouth every 8 (eight) hours.   hydrochlorothiazide 25 MG tablet Commonly known as:  HYDRODIURIL TAKE 1 TABLET DAILY   ibuprofen 800 MG tablet Commonly known as:  ADVIL,MOTRIN Take 1 tablet (800 mg total) by mouth every 8 (eight) hours as needed for headache or mild pain. What changed:  medication strength  how much to take   ONE-A-DAY WOMENS 50 PLUS PO Take 1 tablet by mouth daily.   oxyCODONE-acetaminophen 5-325 MG tablet Commonly known as:  PERCOCET/ROXICET Take 1-2 tablets by mouth every 4 (four) hours as needed for severe pain (moderate to severe pain (when tolerating fluids)).   PARoxetine 20 MG tablet Commonly known as:  PAXIL Take 1 tablet (20 mg total) by mouth daily.   SYNTHROID 200 MCG tablet Generic drug:  levothyroxine Take 200 mcg by mouth daily before breakfast. 6 a week            Discharge Care Instructions  Start     Ordered   06/18/17 0000  gabapentin (NEURONTIN) 400 MG capsule  Every 8 hours     06/18/17 0832   06/18/17 0000  ibuprofen (ADVIL,MOTRIN) 800 MG tablet  Every 8 hours PRN     06/18/17 0832   06/18/17 0000  oxyCODONE-acetaminophen (PERCOCET/ROXICET) 5-325 MG tablet  Every 4 hours PRN     06/18/17 0832   06/18/17 0000  Increase activity slowly     06/18/17 0832   06/18/17 0000  Diet - low sodium heart healthy     06/18/17 0832   06/18/17 0000  Discharge instructions    Comments:  Call [phone] with questions or problems   06/18/17 0832   06/18/17 0000  May walk up steps     06/18/17 0832   06/18/17 0000  May shower / Bathe      06/18/17 4196   06/18/17 0000  Driving Restrictions    Comments:  While taking strong pain medicine   06/18/17 0832   06/18/17 0000  Sexual Activity Restrictions    Comments:  Pelvic rest - no douching, tampons or sex for 6 weeks   06/18/17 2229   06/18/17 0000  Lifting restrictions    Comments:  No greater than 10-15lbs for 2-3 weeks   06/18/17 7989   06/18/17 0000  Call MD for:  persistant nausea and vomiting     06/18/17 0832   06/18/17 0000  Call MD for:  severe uncontrolled pain     06/18/17 0832   06/18/17 0000  Call MD for:  redness, tenderness, or signs of infection (pain, swelling, redness, odor or green/yellow discharge around incision site)     06/18/17 2119     Follow-up Information    Bovard-Stuckert, Alyssamarie Mounsey, MD. Schedule an appointment as soon as possible for a visit in 2 week(s).   Specialty:  Obstetrics and Gynecology Why:  to discuss pathology, also 6 weeks for full post-operative check Contact information: [address] [place] [address] [phone]           Signed: Janyth Contes 06/18/2017, 8:33 AM

## 2017-06-18 NOTE — Progress Notes (Signed)
1 Day Post-Op Procedure(s) (LRB): HYSTERECTOMY TOTAL LAPAROSCOPIC (N/A) POSSIBLE HYSTERECTOMY ABDOMINAL (N/A)  Subjective: Patient reports incisional pain and tolerating PO.  Doing well, pain controlled    Objective: I have reviewed patient's vital signs and intake and output.  General: alert and no distress Resp: clear to auscultation bilaterally Cardio: regular rate and rhythm GI: soft, non-tender; bowel sounds normal; no masses,  no organomegaly Extremities: extremities normal, atraumatic, no cyanosis or edema  Assessment: s/p Procedure(s) with comments: HYSTERECTOMY TOTAL LAPAROSCOPIC (N/A) - MD needs 2.5hrs OR time POSSIBLE HYSTERECTOMY ABDOMINAL (N/A): stable, progressing well and tolerating diet, pain controlled  Plan: Encourage ambulation Discharge home when ambulating, voiding and tolerating po Will d/c with Motrin, percocet, gabapentin.  F/u 2 weeks.    LOS: 0 days    Ena Demary Bovard-Stuckert 06/18/2017, 8:20 AM

## 2017-06-22 NOTE — Op Note (Signed)
NAME:  Chelsea Frye, Chelsea Frye                ACCOUNT NO.:  [id number]  MEDICAL RECORD NO.:  [id number]  LOCATION:  [facility]                          FACILITY:  [facility]  PHYSICIAN:  Thornell Sartorius, MD        DATE OF BIRTH:  [birth year]  DATE OF PROCEDURE:  06/17/2017 DATE OF DISCHARGE:                              OPERATIVE REPORT   PREOPERATIVE DIAGNOSIS:  Intrauterine fibroids.  POSTOPERATIVE DIAGNOSIS:  Intrauterine fibroids.  PROCEDURE:  Total laparoscopic hysterectomy with bilateral salpingectomy.  SURGEON:  Thornell Sartorius, MD.  ASSISTANTCarlynn Purl, D.O.  FINDINGS:  Enlarged fibroid uterus with several anterior fibroids and weight of 684g following the procedure.  Normal ovaries and fallopian tubes.  Normal appendix is also visualized.  ANESTHESIA:  Local with Marcaine and general anesthesia.  INTRAVENOUS FLUIDS:  1800 mL.  URINE OUTPUT:  250 mL clear urine at the end of procedure.  ESTIMATED BLOOD LOSS:  Approximately 100 mL.  COMPLICATIONS:  None.  PATHOLOGY:  Uterus, cervix, and bilateral tubes to Pathology.  DESCRIPTION OF PROCEDURE:  After informed consent was reviewed with the patient including risks, benefits, and alternatives of the surgical procedure, she was transported to the operating room, placed on the table in supine position.  She was then placed in the [place], and general anesthesia was induced, found to be adequate.  She was then prepped and draped in a normal sterile fashion.  A Jaquita Rector was placed in her cervix placing holding sutures in her cervix for the procedure.  This was used for uterine manipulation.  Attention was turned to the abdominal portion of the case.  An approximately 5 mm vertical infraumbilical incision was made.  Pneumoperitoneum was obtained using the Veress needle.  After her abdomen had been insufflated, an umbilical trocar was placed by direct entry.  Accessory ports were placed on both the right and left, 5 mm ports.  They were  placed under direct visualization.  The uterus was grasped with a single-tooth tenaculum and the above-mentioned findings were seen with the large anterior fibroids.  However, with manipulation, the anatomy was all able to be seen.  The tubes were excised from the ovary up to the level of the cornu.  The round ligament was ligated as well using the Harmonic scalpel in a stepwise fashion. The mesosalpinx and cardinal ligaments were excised to the level of the uterine artery at this level.  Attention was turned to the patient's left which in a similar fashion with uterine manipulation and harmonic scalpel.  The tube was excised.  Then, the round ligament and cardinal ligaments were ligated using Harmonic scalpel to the level through where the uterine arteries were.  The Jaquita Rector was then exposed using Harmonic scalpel.  The uterus was pushed through into the vagina with manipulation.  It was replaced to maintain air supply in the vagina.  The cuff was closed with the V-Loc suture with multiple sutures.  They were running locked sutures.  The pedicles were removed and noted to be hemostatic.  The ureters were identified bilaterally and the peristalsis was seen.  The ovaries were noted to be normal.  The ports were removed under direct visualization.  Pneumoperitoneum was evacuated from her abdomen.  The port on the right had been expanded to accommodate the V- Loc suture, to a 10 mm port.  The 67mm port was closed with a deep suture of 0 Vicryl on a UR needle.  The ports were then closed with 3-0 Vicryl subcuticularly, and Dermabond was applied.  The patient tolerated the procedure well.  Sponge, lap, and needle counts were correct x2 per the operating staff.  The patient was awakened and taken in stable condition to the PACU.     Thornell Sartorius, MD     JB/MEDQ  D:  06/21/2017  T:  06/22/2017  Job:  [id number]

## 2017-06-30 DIAGNOSIS — D259 Leiomyoma of uterus, unspecified: Secondary | ICD-10-CM | POA: Insufficient documentation

## 2017-07-03 DIAGNOSIS — E890 Postprocedural hypothyroidism: Secondary | ICD-10-CM | POA: Insufficient documentation

## 2017-07-05 ENCOUNTER — Other Ambulatory Visit: Payer: Self-pay | Admitting: Family Medicine

## 2017-07-15 ENCOUNTER — Other Ambulatory Visit: Payer: Self-pay | Admitting: Family Medicine

## 2017-07-16 ENCOUNTER — Encounter: Payer: Self-pay | Admitting: Family Medicine

## 2017-07-16 NOTE — Telephone Encounter (Signed)
Please call pt. Needs appt in next 1 month, then ok to refill to appt. Thanks.

## 2017-07-16 NOTE — Telephone Encounter (Signed)
Last AEX was 07-27-2015, last appt (abd mass) 11/10/16, last filled 07/28/16 90+3, no appt sch here.Marland KitchenMarland Kitchen

## 2017-07-17 MED ORDER — PAROXETINE HCL 20 MG PO TABS: 20.0000 mg | ORAL_TABLET | Freq: Every day | ORAL | 0 refills | Status: DC

## 2017-07-17 NOTE — Addendum Note (Signed)
Addended by: Dorothyann Gibbs on: 07/17/2017 10:03 AM   Modules accepted: Orders

## 2017-07-17 NOTE — Telephone Encounter (Signed)
Appt sch 10/19, rx sent to pharmacy

## 2017-07-17 NOTE — Telephone Encounter (Signed)
LMTCB

## 2017-08-14 ENCOUNTER — Ambulatory Visit (INDEPENDENT_AMBULATORY_CARE_PROVIDER_SITE_OTHER): Payer: BLUE CROSS/BLUE SHIELD | Admitting: Family Medicine

## 2017-08-14 ENCOUNTER — Encounter: Payer: Self-pay | Admitting: Family Medicine

## 2017-08-14 VITALS — BP 118/80 | HR 68 | Temp 97.4°F | Ht 67.0 in | Wt 166.2 lb

## 2017-08-14 DIAGNOSIS — E038 Other specified hypothyroidism: Secondary | ICD-10-CM

## 2017-08-14 DIAGNOSIS — I10 Essential (primary) hypertension: Secondary | ICD-10-CM

## 2017-08-14 DIAGNOSIS — F411 Generalized anxiety disorder: Secondary | ICD-10-CM

## 2017-08-14 DIAGNOSIS — R232 Flushing: Secondary | ICD-10-CM

## 2017-08-14 LAB — CBC WITH DIFFERENTIAL/PLATELET
BASOS ABS: 30 {cells}/uL (ref 0–200)
Basophils Relative: 0.7 %
EOS ABS: 90 {cells}/uL (ref 15–500)
EOS PCT: 2.1 %
HCT: 37 % (ref 35.0–45.0)
HEMOGLOBIN: 12.7 g/dL (ref 11.7–15.5)
Lymphs Abs: 1324 cells/uL (ref 850–3900)
MCH: 29.3 pg (ref 27.0–33.0)
MCHC: 34.3 g/dL (ref 32.0–36.0)
MCV: 85.5 fL (ref 80.0–100.0)
MONOS PCT: 9.4 %
MPV: 10.7 fL (ref 7.5–12.5)
NEUTROS ABS: 2451 {cells}/uL (ref 1500–7800)
NEUTROS PCT: 57 %
Platelets: 294 10*3/uL (ref 140–400)
RBC: 4.33 10*6/uL (ref 3.80–5.10)
RDW: 13 % (ref 11.0–15.0)
Total Lymphocyte: 30.8 %
WBC mixed population: 404 cells/uL (ref 200–950)
WBC: 4.3 10*3/uL (ref 3.8–10.8)

## 2017-08-14 LAB — BASIC METABOLIC PANEL
BUN: 20 mg/dL (ref 7–25)
CALCIUM: 9.7 mg/dL (ref 8.6–10.4)
CHLORIDE: 99 mmol/L (ref 98–110)
CO2: 28 mmol/L (ref 20–32)
Creat: 0.8 mg/dL (ref 0.50–1.05)
Glucose, Bld: 92 mg/dL (ref 65–99)
POTASSIUM: 3.7 mmol/L (ref 3.5–5.3)
SODIUM: 137 mmol/L (ref 135–146)

## 2017-08-14 MED ORDER — HYDROCHLOROTHIAZIDE 25 MG PO TABS: ORAL_TABLET | ORAL | 3 refills | Status: DC

## 2017-08-14 NOTE — Addendum Note (Signed)
Addended by: Elmer Picker on: 08/14/2017 04:07 PM   Modules accepted: Orders

## 2017-08-14 NOTE — Patient Instructions (Addendum)
BEFORE YOU LEAVE: -labs -see if needs thyroid check and add TSH to labs if needed -follow up: 3-4 months  We have ordered labs or studies at this visit. It can take up to 1-2 weeks for results and processing. IF results require follow up or explanation, we will call you with instructions. Clinically stable results will be released to your [facility]. If you have not heard from [place] or cannot find your results in [facility] in 2 weeks please contact our office at [phone].  If you are not yet signed up for [facility], please consider signing up.    WE NOW OFFER   [facility] FAST TRACK!!!  SAME DAY Appointments for ACUTE CARE  Such as: Sprains, Injuries, cuts, abrasions, rashes, muscle pain, joint pain, back pain Colds, flu, sore throats, headache, allergies, cough, fever  Ear pain, sinus and eye infections Abdominal pain, nausea, vomiting, diarrhea, upset stomach Animal/insect bites  3 Easy Ways to Schedule: Walk-In Scheduling Call in scheduling Mychart Sign-up: [email or web address]     Advise regular aerobic exercise (at least 150 minutes per week of sweaty exercise) and a healthy diet. Try to eat at least 5-9 servings of vegetables and fruits per day (not corn, potatoes or bananas.) Avoid sweets, red meat, pork, butter, fried foods, fast food, processed food, excessive dairy, eggs and coconut. Replace bad fats with good fats - fish, nuts and seeds, canola oil, olive oil.

## 2017-08-14 NOTE — Progress Notes (Signed)
HPI:  Chelsea Frye is a pleasant 51 y.o. here for follow up. Chronic medical problems summarized below were reviewed for changes. Reports she is doing great. She had a hysterectomy about 8 weeks ago and reports she has recovered well and feels great. She is going to the gym on a regular basis and trying to eat healthy. She feels that her mood has been good and wants a refill on her medication for her blood pressure.Denies CP, SOB, DOE, treatment intolerance or new symptoms.   HTN: -doing well on the hctz -trying to eat healthy and stay active -no cp, sob, doe  Hot flashes/anxiety: -prozac working great, wants to continue -denies depression, anxiety, persistent hot flashes  HLD: -due for repeat labs -will do non-fasting cholesterol ratio  Sees endo for thyroid checks   ROS: See pertinent positives and negatives per HPI.  Past Medical History:  Diagnosis Date  . Anemia    history of  . Chicken pox   . Essential hypertension 08/27/2015  . Frequent headaches    Migraines  . Hot flashes   . Hypothyroidism   . PONV (postoperative nausea and vomiting)   . S/P laparoscopic hysterectomy 06/17/2017  . Thyroid cancer ([place]) 20 years ago   hypothyroidism, followed by endocrine  . UTI (urinary tract infection)     Past Surgical History:  Procedure Laterality Date  . ABDOMINAL HYSTERECTOMY N/A 06/17/2017   Procedure: POSSIBLE HYSTERECTOMY ABDOMINAL;  Surgeon: Janyth Contes, MD;  Location: [facility];  Service: Gynecology;  Laterality: N/A;  . COLONOSCOPY    . HERNIA REPAIR    . LAPAROSCOPIC HYSTERECTOMY N/A 06/17/2017   Procedure: HYSTERECTOMY TOTAL LAPAROSCOPIC;  Surgeon: Janyth Contes, MD;  Location: [facility];  Service: Gynecology;  Laterality: N/A;  MD needs 2.5hrs OR time  . SCALP REDUCTION     less than 52 years old  . THYROIDECTOMY  1995    Family History  Problem Relation Age of Onset  . Hyperlipidemia Father   . Hypertension Mother   . Hyperlipidemia  Brother   . Hypertension Brother   . Diabetes Maternal Grandmother   . Diabetes Maternal Aunt   . Colon cancer Neg Hx     Social History   Social History  . Marital status: Married    Spouse name: N/A  . Number of children: N/A  . Years of education: N/A   Social History Main Topics  . Smoking status: Never Smoker  . Smokeless tobacco: Never Used  . Alcohol use Yes     Comment: 1 to 2 glasses of wine weekly   . Drug use: No  . Sexual activity: Not Asked     Comment: vasectomy   Other Topics Concern  . 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  gabapentin (NEURONTIN) 400 MG capsule, Take 1 capsule (400 mg total) by mouth every 8 (eight) hours., Disp: 24 capsule, Rfl: 0 .  hydrochlorothiazide (HYDRODIURIL) 25 MG tablet, TAKE 1 TABLET DAILY PLEASE MAKE AN APPOINTMENT WITH   YOUR DOCTOR, Disp: 90 tablet, Rfl: 0 .  ibuprofen (ADVIL,MOTRIN) 800 MG tablet, Take 1 tablet (800 mg total) by mouth every 8 (  eight) hours as needed for headache or mild pain., Disp: 45 tablet, Rfl: 1 .  Multiple Vitamins-Minerals (ONE-A-DAY WOMENS 50 PLUS PO), Take 1 tablet by mouth daily. , Disp: , Rfl:  .  oxyCODONE-acetaminophen (PERCOCET/ROXICET) 5-325 MG tablet, Take 1-2 tablets by mouth every 4 (four) hours as needed for severe pain (moderate to severe pain (when tolerating fluids))., Disp: 15 tablet, Rfl: 0 .  PARoxetine (PAXIL) 20 MG tablet, Take 1 tablet (20 mg total) by mouth daily., Disp: 30 tablet, Rfl: 0 .  SYNTHROID 200 MCG tablet, Take 200 mcg by mouth daily before breakfast. 6 a week, Disp: , Rfl:   EXAM:  Vitals:   08/14/17 1542  BP: 118/80  Pulse: 68  Temp: (!) 97.4 F (36.3 C)    Body mass index is 26.03 kg/m.  GENERAL: vitals  reviewed and listed above, alert, oriented, appears well hydrated and in no acute distress  HEENT: atraumatic, conjunttiva clear, no obvious abnormalities on inspection of external nose and ears  NECK: no obvious masses on inspection  LUNGS: clear to auscultation bilaterally, no wheezes, rales or rhonchi, good air movement  CV: HRRR, no peripheral edema  MS: moves all extremities without noticeable abnormality  PSYCH: pleasant and cooperative, no obvious depression or anxiety  ASSESSMENT AND PLAN:  Discussed the following assessment and plan:  Essential hypertension - Plan: Basic metabolic panel, CBC  GAD (generalized anxiety disorder)  Hot flashes  Other specified hypothyroidism  -labs -Lifestyle recommendations -Refills as needed -follow up in 4 months -Patient advised to return or notify a doctor immediately if symptoms worsen or persist or new concerns arise.  Patient Instructions  BEFORE YOU LEAVE: -labs -see if needs thyroid check and add TSH to labs if needed -follow up: 3-4 months  We have ordered labs or studies at this visit. It can take up to 1-2 weeks for results and processing. IF results require follow up or explanation, we will call you with instructions. Clinically stable results will be released to your [facility]. If you have not heard from [place] or cannot find your results in [facility] in 2 weeks please contact our office at [phone].  If you are not yet signed up for [facility], please consider signing up.    WE NOW OFFER   [name] FAST TRACK!!!  SAME DAY Appointments for ACUTE CARE  Such as: Sprains, Injuries, cuts, abrasions, rashes, muscle pain, joint pain, back pain Colds, flu, sore throats, headache, allergies, cough, fever  Ear pain, sinus and eye infections Abdominal pain, nausea, vomiting, diarrhea, upset stomach Animal/insect bites  3 Easy Ways to Schedule: Walk-In Scheduling Call in scheduling Mychart Sign-up:  [email or web address]     Advise regular aerobic exercise (at least 150 minutes per week of sweaty exercise) and a healthy diet. Try to eat at least 5-9 servings of vegetables and fruits per day (not corn, potatoes or bananas.) Avoid sweets, red meat, pork, butter, fried foods, fast food, processed food, excessive dairy, eggs and coconut. Replace bad fats with good fats - fish, nuts and seeds, canola oil, olive oil.            Colin Benton R., DO

## 2017-08-31 DIAGNOSIS — Z01419 Encounter for gynecological examination (general) (routine) without abnormal findings: Secondary | ICD-10-CM | POA: Diagnosis not present

## 2017-08-31 DIAGNOSIS — Z6825 Body mass index (BMI) 25.0-25.9, adult: Secondary | ICD-10-CM | POA: Diagnosis not present

## 2017-08-31 DIAGNOSIS — Z130 Encounter for screening for diseases of the blood and blood-forming organs and certain disorders involving the immune mechanism: Secondary | ICD-10-CM | POA: Diagnosis not present

## 2017-08-31 DIAGNOSIS — Z1389 Encounter for screening for other disorder: Secondary | ICD-10-CM | POA: Diagnosis not present

## 2017-08-31 DIAGNOSIS — Z1231 Encounter for screening mammogram for malignant neoplasm of breast: Secondary | ICD-10-CM | POA: Diagnosis not present

## 2017-08-31 LAB — HM MAMMOGRAPHY

## 2017-09-26 IMAGING — CT CT ABD-PELV W/ CM
3 of 6 series · 16 of 46 positions shown, 18 images · IV contrast (agent unspecified)
Comparison: 02/06/2016

CLINICAL DATA: Evaluate bulge in the abdomen.

EXAM:
CT ABDOMEN AND PELVIS WITH CONTRAST
TECHNIQUE: Multidetector CT imaging of the abdomen and pelvis was performed
using the standard protocol following bolus administration of
intravenous contrast.
CONTRAST:  100 cc of [id number]

[Series 2: abd/pel w · axial · 0.80mm/px · z∈[+901,+1271]mm · 11 of 90 slices shown, 13 images]
[im 8/90  soft-tissue]
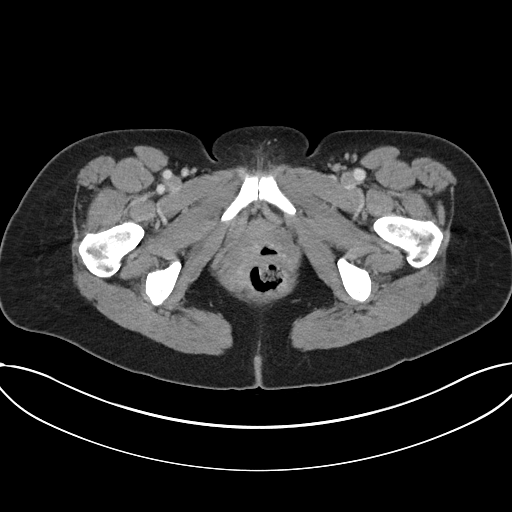
[im 8/90  bone]
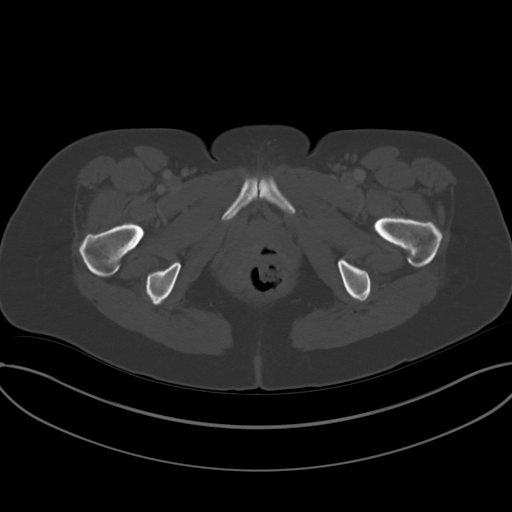
[im 15/90  soft-tissue]
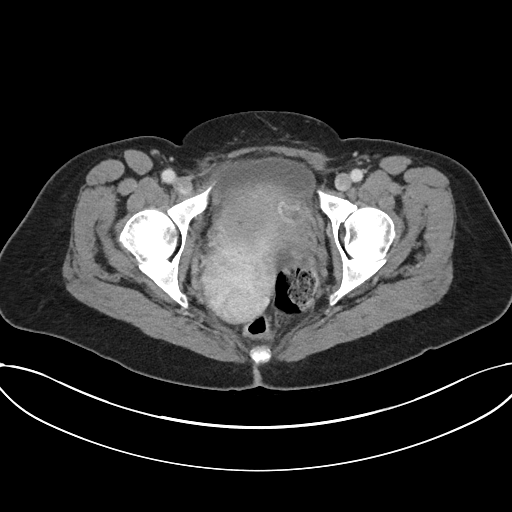
[im 23/90  soft-tissue]
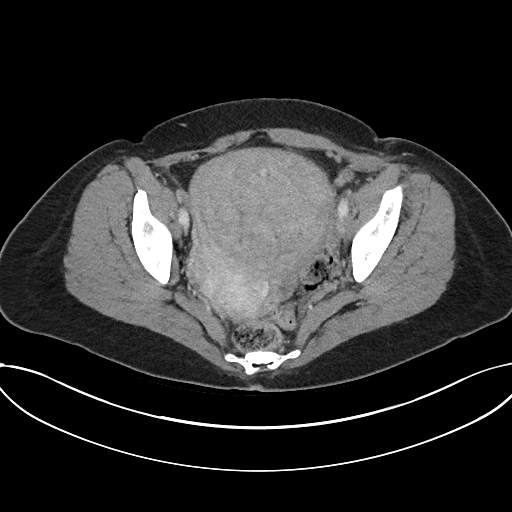
[im 30/90  soft-tissue]
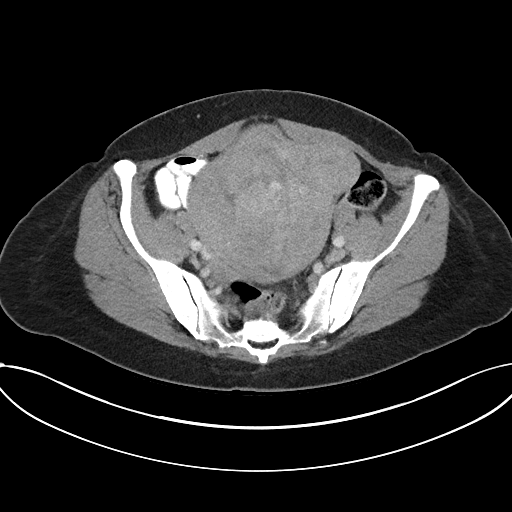
[im 38/90  soft-tissue]
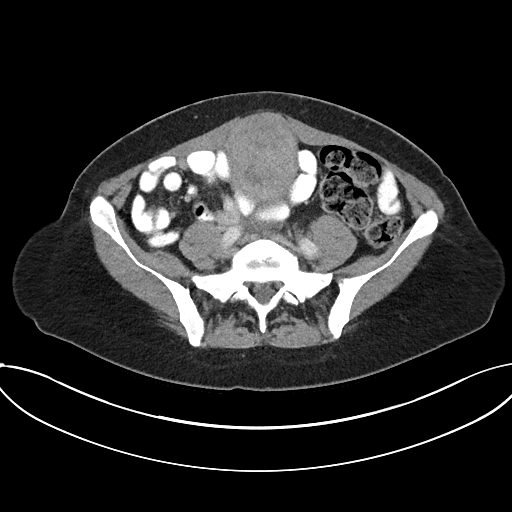
[im 45/90  soft-tissue]
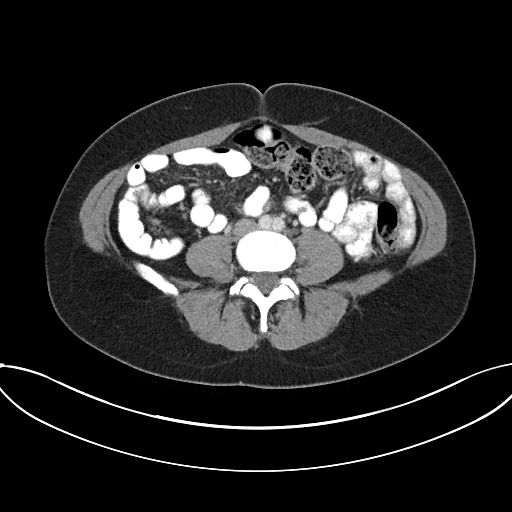
[im 52/90  soft-tissue]
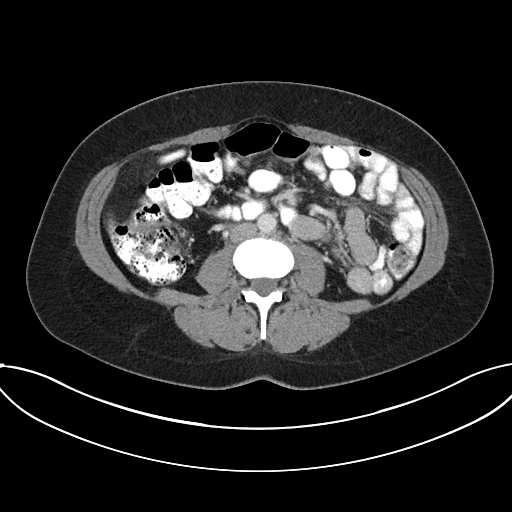
[im 60/90  soft-tissue]
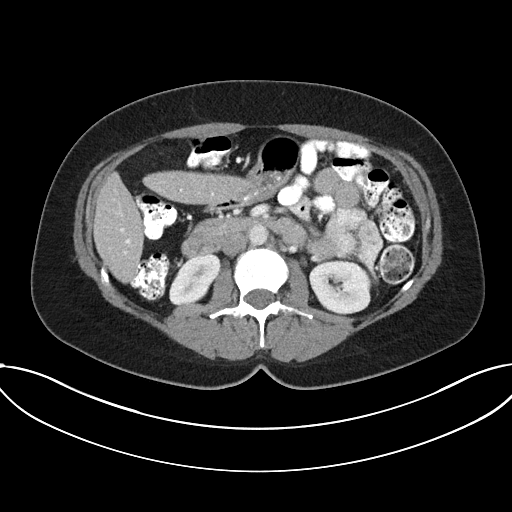
[im 67/90  soft-tissue]
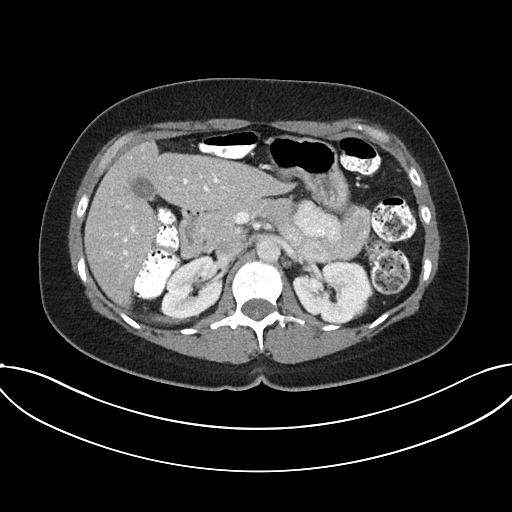
[im 67/90  bone]
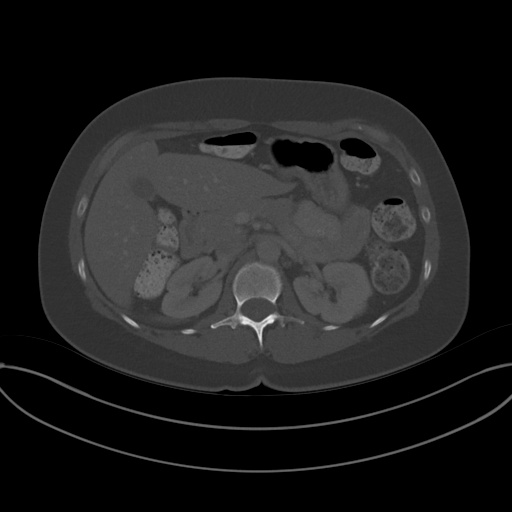
[im 75/90  soft-tissue]
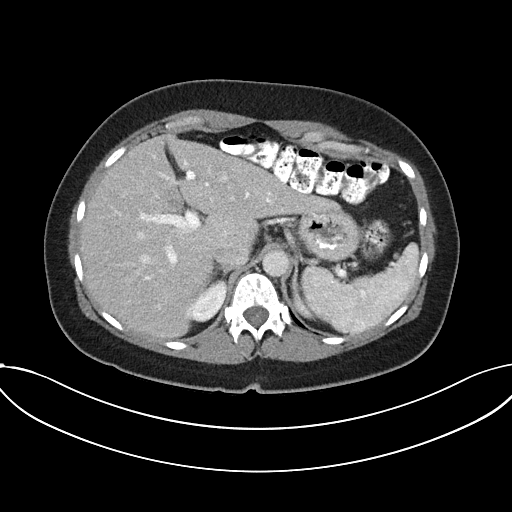
[im 82/90  soft-tissue]
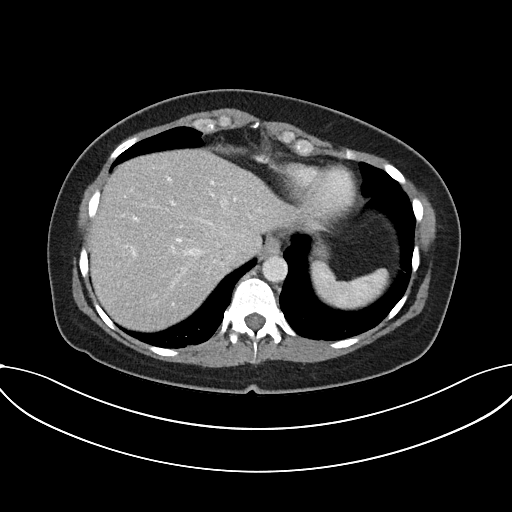

[Series 3: lung · axial · 0.80mm/px · z∈[+906,+946]mm · 2 of 92 slices shown]
[im 9/92  bone]
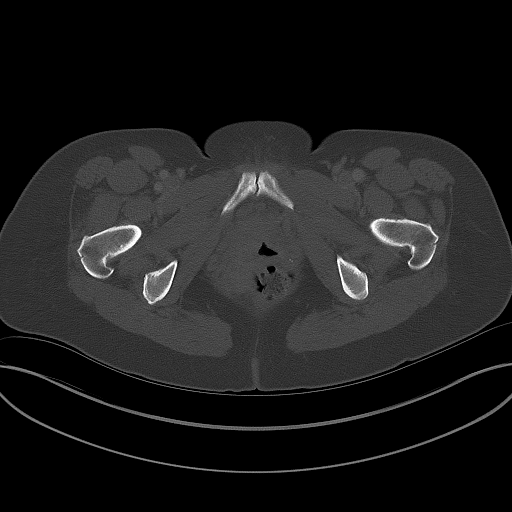
[im 17/92  bone]
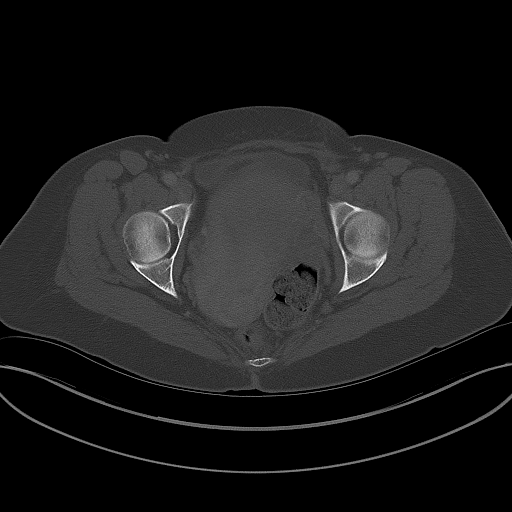

[Series 5: abd/pel w st · coronal · 0.75mm/px · 3 of 79 slices shown]
[im 27/79  soft-tissue]
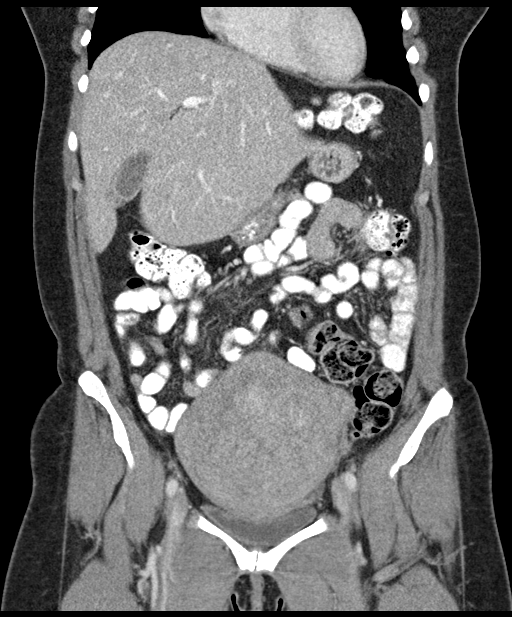
[im 35/79  soft-tissue]
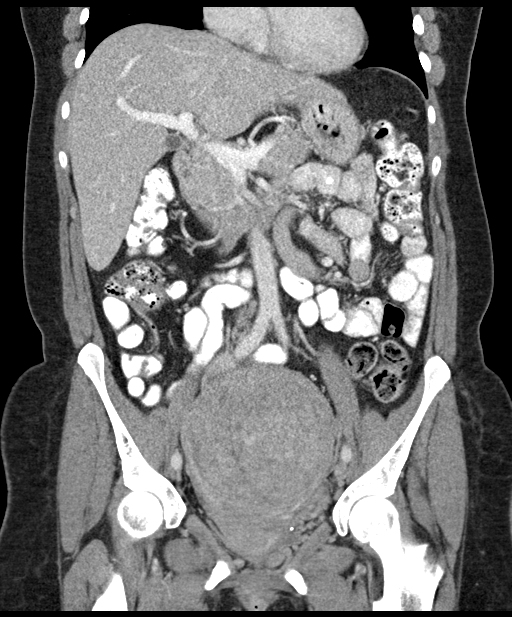
[im 44/79  soft-tissue]
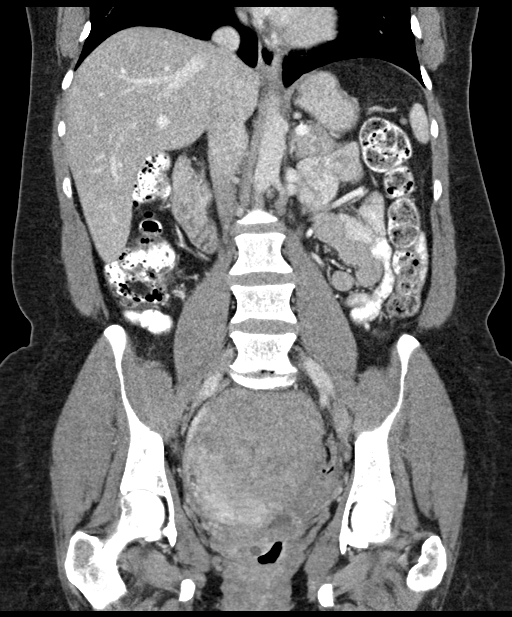

[16 of 46 positions shown; findings below may reference images not displayed]

FINDINGS: Lower chest: No acute abnormality.

Hepatobiliary: No focal liver abnormality is seen. Small gallstone
is identified within the gallbladder fundus. No gallbladder wall
thickening or biliary dilatation.

Pancreas: Unremarkable. No pancreatic ductal dilatation or
surrounding inflammatory changes.

Spleen: Normal in size without focal abnormality.

Adrenals/Urinary Tract: The adrenal glands are normal. Normal
appearance of the right kidney. Low-attenuation structure within the
upper pole of left kidney is too small to characterize measuring 8
mm, image 21 of series 2. No mass or hydronephrosis identified. The
urinary bladder appears normal.

Stomach/Bowel: The stomach appears normal. There is no pathologic
dilatation of the small or large bowel loops. The appendix is
visualized and appears normal.

Vascular/Lymphatic: Normal appearance of the abdominal aorta. No
enlarged retroperitoneal or mesenteric adenopathy. No enlarged
pelvic or inguinal lymph nodes.

Reproductive: There is a markedly enlarged fibroid uterus. This
measures 20.2 x 10.2 x 12.2 cm. The dominant fibroid has a diameter
of 12 cm, image 30 of series 5. This extends ventrally towards the
umbilical region and touches the undersurface of the ventral
abdominal wall.

Other: Very small periumbilical hernia contains fat only.

Musculoskeletal: No aggressive lytic or sclerotic bone lesions.
IMPRESSION: 1. Markedly enlarged fibroid uterus with a volume of approximately
7942 cc. This extends ventrally and I suspect this likely accounts
for the bulge around the umbilical region.
2. No acute findings identified within the abdomen or pelvis.

## 2017-11-05 ENCOUNTER — Encounter: Payer: Self-pay | Admitting: Family Medicine

## 2017-11-12 DIAGNOSIS — H25013 Cortical age-related cataract, bilateral: Secondary | ICD-10-CM | POA: Diagnosis not present

## 2017-11-12 DIAGNOSIS — H2513 Age-related nuclear cataract, bilateral: Secondary | ICD-10-CM | POA: Diagnosis not present

## 2017-11-12 DIAGNOSIS — H40013 Open angle with borderline findings, low risk, bilateral: Secondary | ICD-10-CM | POA: Diagnosis not present

## 2017-11-12 DIAGNOSIS — H53002 Unspecified amblyopia, left eye: Secondary | ICD-10-CM | POA: Diagnosis not present

## 2017-12-02 DIAGNOSIS — E890 Postprocedural hypothyroidism: Secondary | ICD-10-CM | POA: Diagnosis not present

## 2017-12-02 DIAGNOSIS — Z85850 Personal history of malignant neoplasm of thyroid: Secondary | ICD-10-CM | POA: Diagnosis not present

## 2017-12-07 DIAGNOSIS — E890 Postprocedural hypothyroidism: Secondary | ICD-10-CM | POA: Diagnosis not present

## 2017-12-07 DIAGNOSIS — Z85850 Personal history of malignant neoplasm of thyroid: Secondary | ICD-10-CM | POA: Diagnosis not present

## 2017-12-15 ENCOUNTER — Ambulatory Visit: Payer: BLUE CROSS/BLUE SHIELD | Admitting: Family Medicine

## 2017-12-15 ENCOUNTER — Encounter: Payer: Self-pay | Admitting: Family Medicine

## 2017-12-15 VITALS — BP 100/80 | HR 88 | Temp 97.6°F | Ht 67.0 in | Wt 166.5 lb

## 2017-12-15 DIAGNOSIS — F411 Generalized anxiety disorder: Secondary | ICD-10-CM | POA: Diagnosis not present

## 2017-12-15 DIAGNOSIS — E038 Other specified hypothyroidism: Secondary | ICD-10-CM

## 2017-12-15 DIAGNOSIS — I10 Essential (primary) hypertension: Secondary | ICD-10-CM | POA: Diagnosis not present

## 2017-12-15 DIAGNOSIS — E785 Hyperlipidemia, unspecified: Secondary | ICD-10-CM

## 2017-12-15 NOTE — Patient Instructions (Addendum)
BEFORE YOU LEAVE: -Obtain an abstract mammogram to update health maintenance -follow up: About 4 months, we will check labs then   We recommend the following healthy lifestyle for LIFE: 1) Small portions. But, make sure to get regular (at least 3 per day), healthy meals and small healthy snacks if needed.  2) Eat a healthy clean diet.   TRY TO EAT: -at least 5-7 servings of low sugar, colorful, and nutrient rich vegetables per day (not corn, potatoes or bananas.) -berries are the best choice if you wish to eat fruit (only eat small amounts if trying to reduce weight)  -lean meets (fish, white meat of chicken or [place]) -vegan proteins for some meals - beans or tofu, whole grains, nuts and seeds -Replace bad fats with good fats - good fats include: fish, nuts and seeds, canola oil, olive oil -small amounts of low fat or non fat dairy -small amounts of100 % whole grains - check the lables -drink plenty of water  AVOID: -SUGAR, sweets, anything with added sugar, corn syrup or sweeteners - must read labels as even foods advertised as "healthy" often are loaded with sugar -if you must have a sweetener, small amounts of stevia may be best -sweetened beverages and artificially sweetened beverages -simple starches (rice, bread, potatoes, pasta, chips, etc - small amounts of 100% whole grains are ok) -red meat, pork, butter -fried foods, fast food, processed food, excessive dairy, eggs and coconut.  3)Get at least 150 minutes of sweaty aerobic exercise per week.  4)Reduce stress - consider counseling, meditation and relaxation to balance other aspects of your life.

## 2017-12-15 NOTE — Progress Notes (Signed)
Chelsea Frye is a pleasant 52 y.o. here for follow up. Chronic medical problems summarized below were reviewed for changes and stability and were updated as needed below. These issues and their treatment remain stable for the most part.  Reports she is doing well now.  She did have a spell a few months ago where she was feeling cold, tired and was struggling with weight loss.  She had a follow-up with her endocrinologist and found that her thyroid was undertreated.  She is doing much better now.  She has a follow-up next month with her endocrinologist to recheck.  She has been working on a healthy diet regular exercise and has lost 5 pounds since the new treatment for the thyroid.  Reports her mood is good.  Denies CP, SOB, DOE, treatment intolerance or new symptoms.  HTN: -doing well on the hctz -trying to eat healthy and stay active -no cp, sob, doe  Hot flashes/anxiety: -prozac working great, wants to continue -denies depression, anxiety, persistent hot flashes  HLD: -due for repeat labs -will do non-fasting cholesterol ratio  Hypothyroidism: -Seeing endocrinologist for this  ROS: See pertinent positives and negatives per HPI.  Past Medical History:  Diagnosis Date  . Anemia    history of  . Chicken pox   . Essential hypertension 08/27/2015  . Frequent headaches    Migraines  . Hot flashes   . Hypothyroidism   . PONV (postoperative nausea and vomiting)   . S/P laparoscopic hysterectomy 06/17/2017  . Thyroid cancer ([place]) 20 years ago   hypothyroidism, followed by endocrine  . UTI (urinary tract infection)     Past Surgical History:  Procedure Laterality Date  . ABDOMINAL HYSTERECTOMY N/A 06/17/2017   Procedure: POSSIBLE HYSTERECTOMY ABDOMINAL;  Surgeon: Janyth Contes, MD;  Location: [facility];  Service: Gynecology;  Laterality: N/A;  . COLONOSCOPY    . HERNIA REPAIR    . LAPAROSCOPIC HYSTERECTOMY N/A 06/17/2017   Procedure: HYSTERECTOMY TOTAL LAPAROSCOPIC;   Surgeon: Janyth Contes, MD;  Location: [facility];  Service: Gynecology;  Laterality: N/A;  MD needs 2.5hrs OR time  . SCALP REDUCTION     less than 75 years old  . THYROIDECTOMY  1995    Family History  Problem Relation Age of Onset  . Hyperlipidemia Father   . Hypertension Mother   . Hyperlipidemia Brother   . Hypertension Brother   . Diabetes Maternal Grandmother   . Diabetes Maternal Aunt   . Colon cancer Neg Hx     Social History   Socioeconomic History  . Marital status: Married    Spouse name: None  . Number of children: None  . Years of education: None  . Highest education level: None  Social Needs  . Financial resource strain: None  . Food insecurity - worry: None  . Food insecurity - inability: None  . Transportation needs - medical: None  . Transportation needs - non-medical: None  Occupational History  . None  Tobacco Use  . Smoking status: Never Smoker  . Smokeless tobacco: Never Used  Substance and Sexual Activity  . Alcohol use: Yes    Comment: 1 to 2 glasses of wine weekly   . Drug use: No  . Sexual activity: None    Comment: vasectomy  Other Topics Concern  . 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  hydrochlorothiazide (HYDRODIURIL) 25 MG tablet, TAKE 1 TABLET DAILY, Disp: 90 tablet, Rfl: 3 .  levothyroxine (SYNTHROID) 25 MCG tablet, Take 1 tablet in AM on empty stomach 3 days a week for thyroid (name brand only DAW) [in addition to the 200 mcg daily], Disp: , Rfl:  .  Multiple Vitamins-Minerals (ONE-A-DAY WOMENS 50 PLUS PO), Take 1 tablet by mouth daily. , Disp: , Rfl:  .  PARoxetine (PAXIL) 20 MG tablet, Take 1 tablet (20 mg total) by mouth daily., Disp: 30 tablet, Rfl: 0 .  SYNTHROID 200 MCG  tablet, Take 200 mcg by mouth daily before breakfast. , Disp: , Rfl:   EXAM:  Vitals:   12/15/17 1610  BP: 100/80  Pulse: 88  Temp: 97.6 F (36.4 C)    Body mass index is 26.08 kg/m.  GENERAL: vitals reviewed and listed above, alert, oriented, appears well hydrated and in no acute distress  HEENT: atraumatic, conjunttiva clear, no obvious abnormalities on inspection of external nose and ears  NECK: no obvious masses on inspection  LUNGS: clear to auscultation bilaterally, no wheezes, rales or rhonchi, good air movement  CV: HRRR, no peripheral edema  MS: moves all extremities without noticeable abnormality  PSYCH: pleasant and cooperative, no obvious depression or anxiety  ASSESSMENT AND PLAN:  Discussed the following assessment and plan:  Essential hypertension  GAD (generalized anxiety disorder)  Other specified hypothyroidism  Hyperlipidemia, unspecified hyperlipidemia type  -She is due for a fasting lipid panel, she prefers to do that at her next visit -Continue current medications -Lifestyle recommendations Follow-up in about 3-4 months with labs then- -she reports she had her mammogram with her gynecologist, advised my assistant to obtain that report and update the health maintenance  Patient Instructions  BEFORE YOU LEAVE: -Obtain an abstract mammogram to update health maintenance -follow up: About 4 months, we will check labs then   We recommend the following healthy lifestyle for LIFE: 1) Small portions. But, make sure to get regular (at least 3 per day), healthy meals and small healthy snacks if needed.  2) Eat a healthy clean diet.   TRY TO EAT: -at least 5-7 servings of low sugar, colorful, and nutrient rich vegetables per day (not corn, potatoes or bananas.) -berries are the best choice if you wish to eat fruit (only eat small amounts if trying to reduce weight)  -lean meets (fish, white meat of chicken or [place]) -vegan proteins for some  meals - beans or tofu, whole grains, nuts and seeds -Replace bad fats with good fats - good fats include: fish, nuts and seeds, canola oil, olive oil -small amounts of low fat or non fat dairy -small amounts of100 % whole grains - check the lables -drink plenty of water  AVOID: -SUGAR, sweets, anything with added sugar, corn syrup or sweeteners - must read labels as even foods advertised as "healthy" often are loaded with sugar -if you must have a sweetener, small amounts of stevia may be best -sweetened beverages and artificially sweetened beverages -simple starches (rice, bread, potatoes, pasta, chips, etc - small amounts of 100% whole grains are ok) -red meat, pork, butter -fried foods, fast food, processed food, excessive dairy, eggs and coconut.  3)Get at least 150 minutes of sweaty aerobic exercise per week.  4)Reduce stress -  consider counseling, meditation and relaxation to balance other aspects of your life.     Lucretia Kern, DO

## 2018-01-04 ENCOUNTER — Other Ambulatory Visit: Payer: Self-pay | Admitting: Family Medicine

## 2018-01-25 DIAGNOSIS — E890 Postprocedural hypothyroidism: Secondary | ICD-10-CM | POA: Diagnosis not present

## 2018-01-25 DIAGNOSIS — Z85850 Personal history of malignant neoplasm of thyroid: Secondary | ICD-10-CM | POA: Diagnosis not present

## 2018-02-08 DIAGNOSIS — Z79890 Hormone replacement therapy (postmenopausal): Secondary | ICD-10-CM | POA: Diagnosis not present

## 2018-02-08 DIAGNOSIS — E890 Postprocedural hypothyroidism: Secondary | ICD-10-CM | POA: Diagnosis not present

## 2018-02-08 DIAGNOSIS — Z85850 Personal history of malignant neoplasm of thyroid: Secondary | ICD-10-CM | POA: Diagnosis not present

## 2018-04-13 NOTE — Progress Notes (Deleted)
HPI:  Using dictation device. Unfortunately this device frequently misinterprets words/phrases.  Chelsea Frye is a pleasant 52 y.o. here for follow up. Chronic medical problems summarized below were reviewed for changes and stability and were updated as needed below. These issues and their treatment remain stable for the most part. ***. Denies CP, SOB, DOE, treatment intolerance or new symptoms. Do for labs.  HTN: -doing well on the hctz -trying to eat healthy and stay active -no cp, sob, doe  Hot flashes/anxiety: -prozac working great, wants to continue -denies depression, anxiety, persistent hot flashes  HLD: -due for repeat labs -will do non-fasting cholesterol ratio  Hypothyroidism: -Seeing endocrinologist for this   ROS: See pertinent positives and negatives per HPI.  Past Medical History:  Diagnosis Date  . Anemia    history of  . Chicken pox   . Essential hypertension 08/27/2015  . Frequent headaches    Migraines  . Hot flashes   . Hypothyroidism   . PONV (postoperative nausea and vomiting)   . S/P laparoscopic hysterectomy 06/17/2017  . Thyroid cancer ([place]) 20 years ago   hypothyroidism, followed by endocrine  . UTI (urinary tract infection)     Past Surgical History:  Procedure Laterality Date  . ABDOMINAL HYSTERECTOMY N/A 06/17/2017   Procedure: POSSIBLE HYSTERECTOMY ABDOMINAL;  Surgeon: Janyth Contes, MD;  Location: [facility];  Service: Gynecology;  Laterality: N/A;  . COLONOSCOPY    . HERNIA REPAIR    . LAPAROSCOPIC HYSTERECTOMY N/A 06/17/2017   Procedure: HYSTERECTOMY TOTAL LAPAROSCOPIC;  Surgeon: Janyth Contes, MD;  Location: [facility];  Service: Gynecology;  Laterality: N/A;  MD needs 2.5hrs OR time  . SCALP REDUCTION     less than 21 years old  . THYROIDECTOMY  1995    Family History  Problem Relation Age of Onset  . Hyperlipidemia Father   . Hypertension Mother   . Hyperlipidemia Brother   . Hypertension Brother   . Diabetes  Maternal Grandmother   . Diabetes Maternal Aunt   . Colon cancer Neg Hx     SOCIAL HX: ***   Current Outpatient Medications:  .  hydrochlorothiazide (HYDRODIURIL) 25 MG tablet, TAKE 1 TABLET DAILY, Disp: 90 tablet, Rfl: 1 .  levothyroxine (SYNTHROID) 25 MCG tablet, Take 1 tablet in AM on empty stomach 3 days a week for thyroid (name brand only DAW) [in addition to the 200 mcg daily], Disp: , Rfl:  .  Multiple Vitamins-Minerals (ONE-A-DAY WOMENS 50 PLUS PO), Take 1 tablet by mouth daily. , Disp: , Rfl:  .  PARoxetine (PAXIL) 20 MG tablet, Take 1 tablet (20 mg total) by mouth daily., Disp: 30 tablet, Rfl: 0 .  SYNTHROID 200 MCG tablet, Take 200 mcg by mouth daily before breakfast. , Disp: , Rfl:   EXAM:  There were no vitals filed for this visit.  There is no height or weight on file to calculate BMI.  GENERAL: vitals reviewed and listed above, alert, oriented, appears well hydrated and in no acute distress  HEENT: atraumatic, conjunttiva clear, no obvious abnormalities on inspection of external nose and ears  NECK: no obvious masses on inspection  LUNGS: clear to auscultation bilaterally, no wheezes, rales or rhonchi, good air movement  CV: HRRR, no peripheral edema  MS: moves all extremities without noticeable abnormality *** PSYCH: pleasant and cooperative, no obvious depression or anxiety  ASSESSMENT AND PLAN:  Discussed the following assessment and plan:  No diagnosis found.  *** -Patient advised to return or notify a doctor  immediately if symptoms worsen or persist or new concerns arise.  There are no Patient Instructions on file for this visit.  Lucretia Kern, DO

## 2018-04-15 ENCOUNTER — Ambulatory Visit: Payer: BLUE CROSS/BLUE SHIELD | Admitting: Family Medicine

## 2018-06-10 NOTE — Progress Notes (Signed)
HPI:  Using dictation device. Unfortunately this device frequently misinterprets words/phrases.  Colandra Ohanian is a pleasant 52 y.o. here for follow up. Chronic medical problems summarized below were reviewed for changes. Reports is doing well. No complaints. Exercising and eating healthy. Recently had thyroid check with he endocrinologist. Mood is great. Denies CP, SOB, DOE, treatment intolerance or new symptoms. Due for lipid check, BP labs - fasting.  HTN: -doing well on the hctz -trying to eat healthy and stay active -no cp, sob, doe  Hot flashes/anxiety: -paxil working great, wants to continue -denies depression, anxiety, persistent hot flashes  HLD: -lifestyle recommendations provided  Hypothyroidism: -Seeing endocrinologist for this  ROS: See pertinent positives and negatives per HPI.  Past Medical History:  Diagnosis Date  . Anemia    history of  . Chicken pox   . Essential hypertension 08/27/2015  . Frequent headaches    Migraines  . Hot flashes   . Hypothyroidism   . PONV (postoperative nausea and vomiting)   . S/P laparoscopic hysterectomy 06/17/2017  . Thyroid cancer ([place]) 20 years ago   hypothyroidism, followed by endocrine  . UTI (urinary tract infection)     Past Surgical History:  Procedure Laterality Date  . ABDOMINAL HYSTERECTOMY N/A 06/17/2017   Procedure: POSSIBLE HYSTERECTOMY ABDOMINAL;  Surgeon: Janyth Contes, MD;  Location: [facility];  Service: Gynecology;  Laterality: N/A;  . COLONOSCOPY    . HERNIA REPAIR    . LAPAROSCOPIC HYSTERECTOMY N/A 06/17/2017   Procedure: HYSTERECTOMY TOTAL LAPAROSCOPIC;  Surgeon: Janyth Contes, MD;  Location: [facility];  Service: Gynecology;  Laterality: N/A;  MD needs 2.5hrs OR time  . SCALP REDUCTION     less than 41 years old  . THYROIDECTOMY  1995    Family History  Problem Relation Age of Onset  . Hyperlipidemia Father   . Hypertension Mother   . Hyperlipidemia Brother   . Hypertension Brother    . Diabetes Maternal Grandmother   . Diabetes Maternal Aunt   . Colon cancer Neg Hx     SOCIAL HX: see hpi   Current Outpatient Medications:  .  hydrochlorothiazide (HYDRODIURIL) 25 MG tablet, TAKE 1 TABLET DAILY, Disp: 90 tablet, Rfl: 1 .  levothyroxine (SYNTHROID) 25 MCG tablet, Take 1 tablet in AM on empty stomach 3 days a week for thyroid (name brand only DAW) [in addition to the 200 mcg daily], Disp: , Rfl:  .  Multiple Vitamins-Minerals (ONE-A-DAY WOMENS 50 PLUS PO), Take 1 tablet by mouth daily. , Disp: , Rfl:  .  PARoxetine (PAXIL) 20 MG tablet, Take 1 tablet (20 mg total) by mouth daily., Disp: 30 tablet, Rfl: 0 .  SYNTHROID 200 MCG tablet, Take 200 mcg by mouth daily before breakfast. , Disp: , Rfl:   EXAM:  Vitals:   06/14/18 0747  BP: 118/76  Pulse: 78  Temp: 97.7 F (36.5 C)    Body mass index is 26.25 kg/m.  GENERAL: vitals reviewed and listed above, alert, oriented, appears well hydrated and in no acute distress  HEENT: atraumatic, conjunttiva clear, no obvious abnormalities on inspection of external nose and ears  NECK: no obvious masses on inspection  LUNGS: clear to auscultation bilaterally, no wheezes, rales or rhonchi, good air movement  CV: HRRR, no peripheral edema  MS: moves all extremities without noticeable abnormality  PSYCH: pleasant and cooperative, no obvious depression or anxiety  ASSESSMENT AND PLAN:  Discussed the following assessment and plan:  Essential hypertension - Plan: Basic metabolic panel, CBC  with Differential/Platelet  Hyperlipidemia, unspecified hyperlipidemia type - Plan: Lipid panel  GAD (generalized anxiety disorder)  Other specified hypothyroidism  -lifestyle recs -labs today -declined the need for any refills today -she plans to do her flu shot at work -CPE in 4 months, f/u sooner as needed   Patient Instructions  BEFORE YOU LEAVE: -labs -follow up: 4 months for CPE  Get your flu shot at work as  planned.  We have ordered labs or studies at this visit. It can take up to 1-2 weeks for results and processing. IF results require follow up or explanation, we will call you with instructions. Clinically stable results will be released to your [facility]. If you have not heard from [place] or cannot find your results in [facility] in 2 weeks please contact our office at [phone].  If you are not yet signed up for [facility], please consider signing up.  WE NOW OFFER   [facility] FAST TRACK!!!  SAME DAY Appointments for ACUTE CARE  Such as: Sprains, Injuries, cuts, abrasions, rashes, muscle pain, joint pain, back pain Colds, flu, sore throats, headache, allergies, cough, fever  Ear pain, sinus and eye infections Abdominal pain, nausea, vomiting, diarrhea, upset stomach Animal/insect bites  3 Easy Ways to Schedule: Walk-In Scheduling Call in scheduling Mychart Sign-up: [email or web address]                   Lucretia Kern, DO

## 2018-06-14 ENCOUNTER — Encounter: Payer: Self-pay | Admitting: Family Medicine

## 2018-06-14 ENCOUNTER — Ambulatory Visit: Payer: BLUE CROSS/BLUE SHIELD | Admitting: Family Medicine

## 2018-06-14 VITALS — BP 118/76 | HR 78 | Temp 97.7°F | Ht 67.0 in | Wt 167.6 lb

## 2018-06-14 DIAGNOSIS — E785 Hyperlipidemia, unspecified: Secondary | ICD-10-CM

## 2018-06-14 DIAGNOSIS — E038 Other specified hypothyroidism: Secondary | ICD-10-CM

## 2018-06-14 DIAGNOSIS — I10 Essential (primary) hypertension: Secondary | ICD-10-CM | POA: Diagnosis not present

## 2018-06-14 DIAGNOSIS — F411 Generalized anxiety disorder: Secondary | ICD-10-CM

## 2018-06-14 LAB — LIPID PANEL
CHOL/HDL RATIO: 4
CHOLESTEROL: 212 mg/dL — AB (ref 0–200)
HDL: 55.4 mg/dL (ref >39.00)
NONHDL: 156.29
Triglycerides: 335 mg/dL — ABNORMAL HIGH (ref 0.0–149.0)
VLDL: 67 mg/dL — ABNORMAL HIGH (ref 0.0–40.0)

## 2018-06-14 LAB — BASIC METABOLIC PANEL
BUN: 19 mg/dL (ref 6–23)
CHLORIDE: 98 meq/L (ref 96–112)
CO2: 32 mEq/L (ref 19–32)
Calcium: 10.1 mg/dL (ref 8.4–10.5)
Creatinine, Ser: 1.04 mg/dL (ref 0.40–1.20)
GFR: 58.88 mL/min — ABNORMAL LOW (ref >60.00)
Glucose, Bld: 93 mg/dL (ref 70–99)
POTASSIUM: 4.1 meq/L (ref 3.5–5.1)
SODIUM: 138 meq/L (ref 135–145)

## 2018-06-14 LAB — CBC WITH DIFFERENTIAL/PLATELET
BASOS PCT: 0.4 % (ref 0.0–3.0)
Basophils Absolute: 0 10*3/uL (ref 0.0–0.1)
EOS PCT: 1.9 % (ref 0.0–5.0)
Eosinophils Absolute: 0.1 10*3/uL (ref 0.0–0.7)
HEMATOCRIT: 39.8 % (ref 36.0–46.0)
HEMOGLOBIN: 13.4 g/dL (ref 12.0–15.0)
Lymphocytes Relative: 21.7 % (ref 12.0–46.0)
Lymphs Abs: 1 10*3/uL (ref 0.7–4.0)
MCHC: 33.8 g/dL (ref 30.0–36.0)
MCV: 87.9 fl (ref 78.0–100.0)
MONO ABS: 0.3 10*3/uL (ref 0.1–1.0)
MONOS PCT: 7 % (ref 3.0–12.0)
Neutro Abs: 3 10*3/uL (ref 1.4–7.7)
Neutrophils Relative %: 69 % (ref 43.0–77.0)
Platelets: 276 10*3/uL (ref 150.0–400.0)
RBC: 4.53 Mil/uL (ref 3.87–5.11)
RDW: 13.4 % (ref 11.5–15.5)
WBC: 4.4 10*3/uL (ref 4.0–10.5)

## 2018-06-14 LAB — LDL CHOLESTEROL, DIRECT: LDL DIRECT: 119 mg/dL

## 2018-06-14 NOTE — Patient Instructions (Addendum)
BEFORE YOU LEAVE: -labs -follow up: 4 months for CPE  Get your flu shot at work as planned.  We have ordered labs or studies at this visit. It can take up to 1-2 weeks for results and processing. IF results require follow up or explanation, we will call you with instructions. Clinically stable results will be released to your [facility]. If you have not heard from [place] or cannot find your results in [facility] in 2 weeks please contact our office at [phone].  If you are not yet signed up for [facility], please consider signing up.  WE NOW OFFER   [place] [facility] FAST TRACK!!!  SAME DAY Appointments for ACUTE CARE  Such as: Sprains, Injuries, cuts, abrasions, rashes, muscle pain, joint pain, back pain Colds, flu, sore throats, headache, allergies, cough, fever  Ear pain, sinus and eye infections Abdominal pain, nausea, vomiting, diarrhea, upset stomach Animal/insect bites  3 Easy Ways to Schedule: Walk-In Scheduling Call in scheduling Mychart Sign-up: [email or web address]

## 2018-06-21 ENCOUNTER — Telehealth: Payer: Self-pay | Admitting: Family Medicine

## 2018-06-21 ENCOUNTER — Other Ambulatory Visit: Payer: Self-pay | Admitting: *Deleted

## 2018-06-21 MED ORDER — HYDROCHLOROTHIAZIDE 25 MG PO TABS: ORAL_TABLET | ORAL | 1 refills | Status: DC

## 2018-06-21 NOTE — Telephone Encounter (Signed)
Copied from [place] [id number]. Topic: Quick Communication - Lab Results >> Jun 21, 2018 12:56 PM Agnes Lawrence, CMA wrote: Called patient to inform them of lab results. When patient returns call, triage nurse may disclose results.

## 2018-06-21 NOTE — Addendum Note (Signed)
Addended by: Agnes Lawrence on: 06/21/2018 12:57 PM   Modules accepted: Orders

## 2018-06-21 NOTE — Telephone Encounter (Signed)
Rx done. 

## 2018-06-21 NOTE — Telephone Encounter (Signed)
Patient notified of results.

## 2018-07-01 DIAGNOSIS — H40013 Open angle with borderline findings, low risk, bilateral: Secondary | ICD-10-CM | POA: Diagnosis not present

## 2018-08-04 DIAGNOSIS — E890 Postprocedural hypothyroidism: Secondary | ICD-10-CM | POA: Diagnosis not present

## 2018-08-10 DIAGNOSIS — E890 Postprocedural hypothyroidism: Secondary | ICD-10-CM | POA: Diagnosis not present

## 2018-08-10 DIAGNOSIS — Z85850 Personal history of malignant neoplasm of thyroid: Secondary | ICD-10-CM | POA: Diagnosis not present

## 2018-09-01 DIAGNOSIS — Z1389 Encounter for screening for other disorder: Secondary | ICD-10-CM | POA: Diagnosis not present

## 2018-09-01 DIAGNOSIS — Z01419 Encounter for gynecological examination (general) (routine) without abnormal findings: Secondary | ICD-10-CM | POA: Diagnosis not present

## 2018-09-01 DIAGNOSIS — Z6825 Body mass index (BMI) 25.0-25.9, adult: Secondary | ICD-10-CM | POA: Diagnosis not present

## 2018-09-01 DIAGNOSIS — Z1231 Encounter for screening mammogram for malignant neoplasm of breast: Secondary | ICD-10-CM | POA: Diagnosis not present

## 2018-09-01 DIAGNOSIS — Z130 Encounter for screening for diseases of the blood and blood-forming organs and certain disorders involving the immune mechanism: Secondary | ICD-10-CM | POA: Diagnosis not present

## 2018-09-15 ENCOUNTER — Other Ambulatory Visit: Payer: Self-pay | Admitting: Family Medicine

## 2018-10-14 ENCOUNTER — Encounter: Payer: BLUE CROSS/BLUE SHIELD | Admitting: Family Medicine

## 2018-11-27 NOTE — Progress Notes (Signed)
HPI:  Using dictation device. Unfortunately this device frequently misinterprets words/phrases.  Here for CPE:  -Concerns and/or follow up today:   Chronic medical problems summarized below were reviewed for changes. Reports has been doing great. Exercising, eating healthy. Sees endo for thyroid labs. Fasting for lipid recheck today.   HTN: -doing well on the hctz -trying to eat healthy and stay active -no cp, sob, doe  Hot flashes/anxiety: -paxil working great, wants to continue -denies depression, anxiety, persistent hot flashes  HLD: -lifestyle recommendations provided  Hypothyroidism: -Seeing endocrinologist for this, Dr Altheimer  -Taking folic acid, vitamin D or calcium: no -Diabetes and Dyslipidemia Screening: fasting for labs -Vaccines: see vaccine section EPIC -pap history: sees gyn, s/p hysterectomy in 2018 for uterine leiomyoma -FDLMP: see nursing notes -wants STI testing (Hep C if born 70-65): no -FH breast, colon or ovarian ca: see FH Last mammogram: does with [facility], 08/2017 Last colon cancer screening: had colonoscopy 12/2015 Breast Ca Risk Assessment: see family history and pt history DEXA (>/= 65): n/a  -Alcohol, Tobacco, drug use: see social history  Review of Systems - no fevers, unintentional weight loss, vision loss, hearing loss, chest pain, sob, hemoptysis, melena, hematochezia, hematuria, genital discharge, changing or concerning skin lesions, bleeding, bruising, loc, thoughts of self harm or SI  Past Medical History:  Diagnosis Date  . Anemia    history of  . Chicken pox   . Essential hypertension 08/27/2015  . Frequent headaches    Migraines  . Hot flashes   . Hypothyroidism   . PONV (postoperative nausea and vomiting)   . S/P laparoscopic hysterectomy 06/17/2017  . Thyroid cancer ([place]) 20 years ago   hypothyroidism, followed by endocrine  . UTI (urinary tract infection)     Past Surgical History:  Procedure Laterality Date    . ABDOMINAL HYSTERECTOMY N/A 06/17/2017   Procedure: POSSIBLE HYSTERECTOMY ABDOMINAL;  Surgeon: Janyth Contes, MD;  Location: [facility];  Service: Gynecology;  Laterality: N/A;  . COLONOSCOPY    . HERNIA REPAIR    . LAPAROSCOPIC HYSTERECTOMY N/A 06/17/2017   Procedure: HYSTERECTOMY TOTAL LAPAROSCOPIC;  Surgeon: Janyth Contes, MD;  Location: [facility];  Service: Gynecology;  Laterality: N/A;  MD needs 2.5hrs OR time  . SCALP REDUCTION     less than 14 years old  . THYROIDECTOMY  1995    Family History  Problem Relation Age of Onset  . Hyperlipidemia Father   . Hypertension Mother   . Hyperlipidemia Brother   . Hypertension Brother   . Diabetes Maternal Grandmother   . Diabetes Maternal Aunt   . Colon cancer Neg Hx     Social History   Socioeconomic History  . Marital status: Married    Spouse name: Not on file  . Number of children: Not on file  . Years of education: Not on file  . Highest education level: Not on file  Occupational History  . Not on file  Social Needs  . Financial resource strain: Not on file  . Food insecurity:    Worry: Not on file    Inability: Not on file  . Transportation needs:    Medical: Not on file    Non-medical: Not on file  Tobacco Use  . Smoking status: Never Smoker  . Smokeless tobacco: Never Used  Substance and Sexual Activity  . Alcohol use: Yes    Comment: 1 to 2 glasses of wine weekly   . Drug use: No  . Sexual activity: Not on  file    Comment: vasectomy  Lifestyle  . Physical activity:    Days per week: Not on file    Minutes per session: Not on file  . Stress: Not on file  Relationships  . Social connections:    Talks on phone: Not on file    Gets together: Not on file    Attends religious service: Not on file    Active member of club or organization: Not on file    Attends meetings of clubs or organizations: Not on file    Relationship status: Not on file  Other Topics Concern  . Not on file  Social History  Narrative   Work or School: works a Bank[employer] Situation: lives with husband and 4 children      Spiritual Beliefs: Catholic      Lifestyle: bikes and walks and does elliptical; diet is healthy              Current Outpatient Medications:  .  hydrochlorothiazide (HYDRODIURIL) 25 MG tablet, TAKE 1 TABLET DAILY, Disp: 90 tablet, Rfl: 1 .  levothyroxine (SYNTHROID) 25 MCG tablet, Take 1 tablet in AM on empty stomach 3 days a week for thyroid (name brand only DAW) [in addition to the 200 mcg daily], Disp: , Rfl:  .  Multiple Vitamins-Minerals (ONE-A-DAY WOMENS 50 PLUS PO), Take 1 tablet by mouth daily. , Disp: , Rfl:  .  PARoxetine (PAXIL) 20 MG tablet, TAKE 1 TABLET BY MOUTH EVERY DAY, Disp: 90 tablet, Rfl: 1 .  SYNTHROID 200 MCG tablet, Take 200 mcg by mouth daily before breakfast. , Disp: , Rfl:   EXAM:  Vitals:   11/29/18 0828  BP: 102/70  Pulse: 77  Temp: 97.8 F (36.6 C)    GENERAL: vitals reviewed and listed below, alert, oriented, appears well hydrated and in no acute distress  HEENT: head atraumatic, PERRLA, normal appearance of eyes, ears, nose and mouth. moist mucus membranes.  NECK: supple, no masses or lymphadenopathy  LUNGS: clear to auscultation bilaterally, no rales, rhonchi or wheeze  CV: HRRR, no peripheral edema or cyanosis, normal pedal pulses  ABDOMEN: bowel sounds normal, soft, non tender to palpation, no masses, no rebound or guarding  GU/BREAST: declined, does with gyn  SKIN: no rash or abnormal lesions, declined full skin exam  MS: normal gait, moves all extremities normally  NEURO: normal gait, speech and thought processing grossly intact, muscle tone grossly intact throughout  PSYCH: normal affect, pleasant and cooperative  ASSESSMENT AND PLAN:  Discussed the following assessment and plan:  PREVENTIVE EXAM: -Discussed and advised all [place] preventive services health task force level A and B recommendations for age, sex and  risks. -Advised at least 150 minutes of exercise per week and a healthy diet  -labs, studies and vaccines per orders this encounter -discussed shingles vaccine, she prefers to check with insurance  2. Essential hypertension -cont current tx - Basic metabolic panel - CBC  3. Other specified hypothyroidism -sees endo for management, declined check here  4. Hot flashes -stable  5. GAD (generalized anxiety disorder) -stable, cont current tx  6. Hyperlipidemia, unspecified hyperlipidemia type/Overweight: - Lipid panel -lifestyle recs   Patient advised to return to clinic immediately if symptoms worsen or persist or new concerns.  Patient Instructions  BEFORE YOU LEAVE: -labs -follow up: 6 months for follow up  Consider the shingles vaccine.  We have ordered labs or studies at this visit. It can take  up to 1-2 weeks for results and processing. IF results require follow up or explanation, we will call you with instructions. Clinically stable results will be released to your [facility]. If you have not heard from [place] or cannot find your results in [facility] in 2 weeks please contact our office at [phone].  If you are not yet signed up for [facility], please consider signing up.   Preventive Care 40-64 Years, Female Preventive care refers to lifestyle choices and visits with your health care provider that can promote health and wellness. What does preventive care include?   A yearly physical exam. This is also called an annual well check.  Dental exams once or twice a year.  Routine eye exams. Ask your health care provider how often you should have your eyes checked.  Personal lifestyle choices, including: ? Daily care of your teeth and gums. ? Regular physical activity. ? Eating a healthy diet. ? Avoiding tobacco and drug use. ? Limiting alcohol use. ? Practicing safe sex. ? Taking vitamin and mineral supplements as recommended by your health care provider. What happens  during an annual well check? The services and screenings done by your health care provider during your annual well check will depend on your age, overall health, lifestyle risk factors, and family history of disease. Counseling Your health care provider may ask you questions about your:  Alcohol use.  Tobacco use.  Drug use.  Emotional well-being.  Home and relationship well-being.  Sexual activity.  Eating habits.  Work and work Statistician.  Method of birth control.  Menstrual cycle.  Pregnancy history. Screening You may have the following tests or measurements:  Height, weight, and BMI.  Blood pressure.  Lipid and cholesterol levels. These may be checked every 5 years, or more frequently if you are over 39 years old.  Skin check.  Lung cancer screening. You may have this screening every year starting at age 51 if you have a 30-pack-year history of smoking and currently smoke or have quit within the past 15 years.  Colorectal cancer screening. All adults should have this screening starting at age 64 and continuing until age 27. Your health care provider may recommend screening at age 63. You will have tests every 1-10 years, depending on your results and the type of screening test. People at increased risk should start screening at an earlier age. Screening tests may include: ? Guaiac-based fecal occult blood testing. ? Fecal immunochemical test (FIT). ? Stool DNA test. ? Virtual colonoscopy. ? Sigmoidoscopy. During this test, a flexible tube with a tiny camera (sigmoidoscope) is used to examine your rectum and lower colon. The sigmoidoscope is inserted through your anus into your rectum and lower colon. ? Colonoscopy. During this test, a long, thin, flexible tube with a tiny camera (colonoscope) is used to examine your entire colon and rectum.  Hepatitis C blood test.  Hepatitis B blood test.  Sexually transmitted disease (STD) testing.  Diabetes screening.  This is done by checking your blood sugar (glucose) after you have not eaten for a while (fasting). You may have this done every 1-3 years.  Mammogram. This may be done every 1-2 years. Talk to your health care provider about when you should start having regular mammograms. This may depend on whether you have a family history of breast cancer.  BRCA-related cancer screening. This may be done if you have a family history of breast, ovarian, tubal, or peritoneal cancers.  Pelvic exam and Pap test. This may be  done every 3 years starting at age 41. Starting at age 25, this may be done every 5 years if you have a Pap test in combination with an HPV test.  Bone density scan. This is done to screen for osteoporosis. You may have this scan if you are at high risk for osteoporosis. Discuss your test results, treatment options, and if necessary, the need for more tests with your health care provider. Vaccines Your health care provider may recommend certain vaccines, such as:  Influenza vaccine. This is recommended every year.  Tetanus, diphtheria, and acellular pertussis (Tdap, Td) vaccine. You may need a Td booster every 10 years.  Varicella vaccine. You may need this if you have not been vaccinated.  Zoster vaccine. You may need this after age 38.  Measles, mumps, and rubella (MMR) vaccine. You may need at least one dose of MMR if you were born in 1957 or later. You may also need a second dose.  Pneumococcal 13-valent conjugate (PCV13) vaccine. You may need this if you have certain conditions and were not previously vaccinated.  Pneumococcal polysaccharide (PPSV23) vaccine. You may need one or two doses if you smoke cigarettes or if you have certain conditions.  Meningococcal vaccine. You may need this if you have certain conditions.  Hepatitis A vaccine. You may need this if you have certain conditions or if you travel or work in places where you may be exposed to hepatitis A.  Hepatitis B  vaccine. You may need this if you have certain conditions or if you travel or work in places where you may be exposed to hepatitis B.  Haemophilus influenzae type b (Hib) vaccine. You may need this if you have certain conditions. Talk to your health care provider about which screenings and vaccines you need and how often you need them. This information is not intended to replace advice given to you by your health care provider. Make sure you discuss any questions you have with your health care provider. Document Released: 11/09/2015 Document Revised: 12/03/2017 Document Reviewed: 08/14/2015 Elsevier Interactive Patient Education  2019 [employer].          No follow-ups on file.  Lucretia Kern, DO

## 2018-11-29 ENCOUNTER — Encounter: Payer: Self-pay | Admitting: Family Medicine

## 2018-11-29 ENCOUNTER — Ambulatory Visit (INDEPENDENT_AMBULATORY_CARE_PROVIDER_SITE_OTHER): Payer: BLUE CROSS/BLUE SHIELD | Admitting: Family Medicine

## 2018-11-29 VITALS — BP 102/70 | HR 77 | Temp 97.8°F | Ht 67.0 in | Wt 162.9 lb

## 2018-11-29 DIAGNOSIS — Z0000 Encounter for general adult medical examination without abnormal findings: Secondary | ICD-10-CM | POA: Diagnosis not present

## 2018-11-29 DIAGNOSIS — E785 Hyperlipidemia, unspecified: Secondary | ICD-10-CM | POA: Diagnosis not present

## 2018-11-29 DIAGNOSIS — R232 Flushing: Secondary | ICD-10-CM

## 2018-11-29 DIAGNOSIS — E038 Other specified hypothyroidism: Secondary | ICD-10-CM

## 2018-11-29 DIAGNOSIS — R7989 Other specified abnormal findings of blood chemistry: Secondary | ICD-10-CM

## 2018-11-29 DIAGNOSIS — I10 Essential (primary) hypertension: Secondary | ICD-10-CM

## 2018-11-29 DIAGNOSIS — F411 Generalized anxiety disorder: Secondary | ICD-10-CM

## 2018-11-29 LAB — CBC
HEMATOCRIT: 39.5 % (ref 36.0–46.0)
HEMOGLOBIN: 13.5 g/dL (ref 12.0–15.0)
MCHC: 34.1 g/dL (ref 30.0–36.0)
MCV: 87.3 fl (ref 78.0–100.0)
Platelets: 287 10*3/uL (ref 150.0–400.0)
RBC: 4.52 Mil/uL (ref 3.87–5.11)
RDW: 13.4 % (ref 11.5–15.5)
WBC: 3.6 10*3/uL — ABNORMAL LOW (ref 4.0–10.5)

## 2018-11-29 LAB — BASIC METABOLIC PANEL
BUN: 20 mg/dL (ref 6–23)
CHLORIDE: 100 meq/L (ref 96–112)
CO2: 30 mEq/L (ref 19–32)
CREATININE: 0.93 mg/dL (ref 0.40–1.20)
Calcium: 9.7 mg/dL (ref 8.4–10.5)
GFR: 62.91 mL/min (ref >60.00)
GLUCOSE: 101 mg/dL — AB (ref 70–99)
POTASSIUM: 4 meq/L (ref 3.5–5.1)
Sodium: 139 mEq/L (ref 135–145)

## 2018-11-29 LAB — LIPID PANEL
Cholesterol: 244 mg/dL — ABNORMAL HIGH (ref 0–200)
HDL: 61.6 mg/dL (ref >39.00)
LDL Cholesterol: 154 mg/dL — ABNORMAL HIGH (ref 0–99)
NONHDL: 181.91
Total CHOL/HDL Ratio: 4
Triglycerides: 140 mg/dL (ref 0.0–149.0)
VLDL: 28 mg/dL (ref 0.0–40.0)

## 2018-11-29 NOTE — Patient Instructions (Signed)
BEFORE YOU LEAVE: -labs -follow up: 6 months for follow up  Consider the shingles vaccine.  We have ordered labs or studies at this visit. It can take up to 1-2 weeks for results and processing. IF results require follow up or explanation, we will call you with instructions. Clinically stable results will be released to your MYCHART. If you have not heard from us or cannot find your results in MYCHART in 2 weeks please contact our office at [phone].  If you are not yet signed up for MYCHART, please consider signing up.   Preventive Care 40-64 Years, Female Preventive care refers to lifestyle choices and visits with your health care provider that can promote health and wellness. What does preventive care include?   A yearly physical exam. This is also called an annual well check.  Dental exams once or twice a year.  Routine eye exams. Ask your health care provider how often you should have your eyes checked.  Personal lifestyle choices, including: ? Daily care of your teeth and gums. ? Regular physical activity. ? Eating a healthy diet. ? Avoiding tobacco and drug use. ? Limiting alcohol use. ? Practicing safe sex. ? Taking vitamin and mineral supplements as recommended by your health care provider. What happens during an annual well check? The services and screenings done by your health care provider during your annual well check will depend on your age, overall health, lifestyle risk factors, and family history of disease. Counseling Your health care provider may ask you questions about your:  Alcohol use.  Tobacco use.  Drug use.  Emotional well-being.  Home and relationship well-being.  Sexual activity.  Eating habits.  Work and work environment.  Method of birth control.  Menstrual cycle.  Pregnancy history. Screening You may have the following tests or measurements:  Height, weight, and BMI.  Blood pressure.  Lipid and cholesterol levels. These  may be checked every 5 years, or more frequently if you are over 50 years old.  Skin check.  Lung cancer screening. You may have this screening every year starting at age 55 if you have a 30-pack-year history of smoking and currently smoke or have quit within the past 15 years.  Colorectal cancer screening. All adults should have this screening starting at age 50 and continuing until age 75. Your health care provider may recommend screening at age 45. You will have tests every 1-10 years, depending on your results and the type of screening test. People at increased risk should start screening at an earlier age. Screening tests may include: ? Guaiac-based fecal occult blood testing. ? Fecal immunochemical test (FIT). ? Stool DNA test. ? Virtual colonoscopy. ? Sigmoidoscopy. During this test, a flexible tube with a tiny camera (sigmoidoscope) is used to examine your rectum and lower colon. The sigmoidoscope is inserted through your anus into your rectum and lower colon. ? Colonoscopy. During this test, a long, thin, flexible tube with a tiny camera (colonoscope) is used to examine your entire colon and rectum.  Hepatitis C blood test.  Hepatitis B blood test.  Sexually transmitted disease (STD) testing.  Diabetes screening. This is done by checking your blood sugar (glucose) after you have not eaten for a while (fasting). You may have this done every 1-3 years.  Mammogram. This may be done every 1-2 years. Talk to your health care provider about when you should start having regular mammograms. This may depend on whether you have a family history of breast cancer.  BRCA-related   cancer screening. This may be done if you have a family history of breast, ovarian, tubal, or peritoneal cancers.  Pelvic exam and Pap test. This may be done every 3 years starting at age 11. Starting at age 72, this may be done every 5 years if you have a Pap test in combination with an HPV test.  Bone density scan.  This is done to screen for osteoporosis. You may have this scan if you are at high risk for osteoporosis. Discuss your test results, treatment options, and if necessary, the need for more tests with your health care provider. Vaccines Your health care provider may recommend certain vaccines, such as:  Influenza vaccine. This is recommended every year.  Tetanus, diphtheria, and acellular pertussis (Tdap, Td) vaccine. You may need a Td booster every 10 years.  Varicella vaccine. You may need this if you have not been vaccinated.  Zoster vaccine. You may need this after age 93.  Measles, mumps, and rubella (MMR) vaccine. You may need at least one dose of MMR if you were born in 1957 or later. You may also need a second dose.  Pneumococcal 13-valent conjugate (PCV13) vaccine. You may need this if you have certain conditions and were not previously vaccinated.  Pneumococcal polysaccharide (PPSV23) vaccine. You may need one or two doses if you smoke cigarettes or if you have certain conditions.  Meningococcal vaccine. You may need this if you have certain conditions.  Hepatitis A vaccine. You may need this if you have certain conditions or if you travel or work in places where you may be exposed to hepatitis A.  Hepatitis B vaccine. You may need this if you have certain conditions or if you travel or work in places where you may be exposed to hepatitis B.  Haemophilus influenzae type b (Hib) vaccine. You may need this if you have certain conditions. Talk to your health care provider about which screenings and vaccines you need and how often you need them. This information is not intended to replace advice given to you by your health care provider. Make sure you discuss any questions you have with your health care provider. Document Released: 11/09/2015 Document Revised: 12/03/2017 Document Reviewed: 08/14/2015 Elsevier Interactive Patient Education  2019 [employer].

## 2018-12-01 ENCOUNTER — Telehealth: Payer: Self-pay | Admitting: Family Medicine

## 2018-12-01 NOTE — Telephone Encounter (Signed)
Copied from [place] [phone] [phone]. Topic: Quick Communication - Lab Results (Clinic Use ONLY) >> Dec 01, 2018  4:16 PM Agnes Lawrence, CMA wrote: Called patient to inform them of lab results. When patient returns call, triage nurse may disclose results.

## 2018-12-01 NOTE — Telephone Encounter (Signed)
Lab results given and documented in result note 

## 2018-12-01 NOTE — Addendum Note (Signed)
Addended by: Agnes Lawrence on: 12/01/2018 04:14 PM   Modules accepted: Orders

## 2018-12-19 ENCOUNTER — Other Ambulatory Visit: Payer: Self-pay | Admitting: Family Medicine

## 2018-12-27 DIAGNOSIS — H25013 Cortical age-related cataract, bilateral: Secondary | ICD-10-CM | POA: Diagnosis not present

## 2018-12-27 DIAGNOSIS — H40023 Open angle with borderline findings, high risk, bilateral: Secondary | ICD-10-CM | POA: Diagnosis not present

## 2018-12-27 DIAGNOSIS — H2513 Age-related nuclear cataract, bilateral: Secondary | ICD-10-CM | POA: Diagnosis not present

## 2018-12-27 DIAGNOSIS — H40052 Ocular hypertension, left eye: Secondary | ICD-10-CM | POA: Diagnosis not present

## 2018-12-30 ENCOUNTER — Other Ambulatory Visit: Payer: BLUE CROSS/BLUE SHIELD

## 2019-01-04 ENCOUNTER — Other Ambulatory Visit (INDEPENDENT_AMBULATORY_CARE_PROVIDER_SITE_OTHER): Payer: BLUE CROSS/BLUE SHIELD

## 2019-01-04 DIAGNOSIS — E785 Hyperlipidemia, unspecified: Secondary | ICD-10-CM

## 2019-01-04 DIAGNOSIS — R7989 Other specified abnormal findings of blood chemistry: Secondary | ICD-10-CM

## 2019-01-04 LAB — CBC WITH DIFFERENTIAL/PLATELET
Basophils Absolute: 0 10*3/uL (ref 0.0–0.1)
Basophils Relative: 0.9 % (ref 0.0–3.0)
Eosinophils Absolute: 0.1 10*3/uL (ref 0.0–0.7)
Eosinophils Relative: 2.1 % (ref 0.0–5.0)
HCT: 39.8 % (ref 36.0–46.0)
Hemoglobin: 13.8 g/dL (ref 12.0–15.0)
LYMPHS ABS: 0.9 10*3/uL (ref 0.7–4.0)
Lymphocytes Relative: 28.3 % (ref 12.0–46.0)
MCHC: 34.7 g/dL (ref 30.0–36.0)
MCV: 87.4 fl (ref 78.0–100.0)
Monocytes Absolute: 0.2 10*3/uL (ref 0.1–1.0)
Monocytes Relative: 6.5 % (ref 3.0–12.0)
NEUTROS ABS: 2 10*3/uL (ref 1.4–7.7)
Neutrophils Relative %: 62.2 % (ref 43.0–77.0)
PLATELETS: 310 10*3/uL (ref 150.0–400.0)
RBC: 4.55 Mil/uL (ref 3.87–5.11)
RDW: 13.5 % (ref 11.5–15.5)
WBC: 3.2 10*3/uL — ABNORMAL LOW (ref 4.0–10.5)

## 2019-01-04 LAB — LIPID PANEL
CHOL/HDL RATIO: 4
Cholesterol: 242 mg/dL — ABNORMAL HIGH (ref 0–200)
HDL: 67.4 mg/dL (ref >39.00)
NonHDL: 174.14
Triglycerides: 273 mg/dL — ABNORMAL HIGH (ref 0.0–149.0)
VLDL: 54.6 mg/dL — AB (ref 0.0–40.0)

## 2019-01-04 LAB — LDL CHOLESTEROL, DIRECT: Direct LDL: 153 mg/dL

## 2019-01-26 ENCOUNTER — Telehealth: Payer: Self-pay | Admitting: *Deleted

## 2019-01-26 NOTE — Telephone Encounter (Signed)
I called the pt and she stated she does not want to schedule a new patient visit at this time and would like to see Dr Ethlyn Gallery.  She was instructed to call back and schedule a transfer of care visit with Dr Ethlyn Gallery at a convenient time for her.

## 2019-01-26 NOTE — Telephone Encounter (Signed)
Copied from [place] [id number]. Topic: Appointment Scheduling - Transfer of Care >> Jan 25, 2019  4:38 PM Reyne Dumas L wrote: Pt called and left message on [place].  States she received letter regarding Dr. Maudie Mercury and it told her to call in and be given a new provider. Pt can be reached at [phone].

## 2019-01-31 ENCOUNTER — Encounter: Payer: BLUE CROSS/BLUE SHIELD | Admitting: Internal Medicine

## 2019-02-02 ENCOUNTER — Inpatient Hospital Stay: Payer: BLUE CROSS/BLUE SHIELD | Attending: Internal Medicine | Admitting: Internal Medicine

## 2019-02-02 ENCOUNTER — Encounter: Payer: Self-pay | Admitting: Internal Medicine

## 2019-02-02 ENCOUNTER — Other Ambulatory Visit: Payer: Self-pay

## 2019-02-02 ENCOUNTER — Inpatient Hospital Stay: Payer: BLUE CROSS/BLUE SHIELD

## 2019-02-02 VITALS — BP 131/92 | HR 86 | Temp 97.8°F | Resp 18 | Ht 67.0 in | Wt 166.8 lb

## 2019-02-02 DIAGNOSIS — D539 Nutritional anemia, unspecified: Secondary | ICD-10-CM | POA: Diagnosis not present

## 2019-02-02 DIAGNOSIS — F419 Anxiety disorder, unspecified: Secondary | ICD-10-CM | POA: Diagnosis not present

## 2019-02-02 DIAGNOSIS — Z114 Encounter for screening for human immunodeficiency virus [HIV]: Secondary | ICD-10-CM

## 2019-02-02 DIAGNOSIS — D72818 Other decreased white blood cell count: Secondary | ICD-10-CM | POA: Diagnosis not present

## 2019-02-02 DIAGNOSIS — I10 Essential (primary) hypertension: Secondary | ICD-10-CM | POA: Diagnosis not present

## 2019-02-02 DIAGNOSIS — Z90710 Acquired absence of both cervix and uterus: Secondary | ICD-10-CM | POA: Diagnosis not present

## 2019-02-02 DIAGNOSIS — Z801 Family history of malignant neoplasm of trachea, bronchus and lung: Secondary | ICD-10-CM | POA: Diagnosis not present

## 2019-02-02 DIAGNOSIS — D219 Benign neoplasm of connective and other soft tissue, unspecified: Secondary | ICD-10-CM

## 2019-02-02 DIAGNOSIS — Z85850 Personal history of malignant neoplasm of thyroid: Secondary | ICD-10-CM | POA: Insufficient documentation

## 2019-02-02 DIAGNOSIS — C73 Malignant neoplasm of thyroid gland: Secondary | ICD-10-CM

## 2019-02-02 LAB — CBC WITH DIFFERENTIAL (CANCER CENTER ONLY)
Abs Immature Granulocytes: 0 10*3/uL (ref 0.00–0.07)
Basophils Absolute: 0 10*3/uL (ref 0.0–0.1)
Basophils Relative: 1 %
Eosinophils Absolute: 0.1 10*3/uL (ref 0.0–0.5)
Eosinophils Relative: 1 %
HCT: 42.1 % (ref 36.0–46.0)
Hemoglobin: 14.2 g/dL (ref 12.0–15.0)
Immature Granulocytes: 0 %
Lymphocytes Relative: 26 %
Lymphs Abs: 1 10*3/uL (ref 0.7–4.0)
MCH: 29.5 pg (ref 26.0–34.0)
MCHC: 33.7 g/dL (ref 30.0–36.0)
MCV: 87.5 fL (ref 80.0–100.0)
Monocytes Absolute: 0.3 10*3/uL (ref 0.1–1.0)
Monocytes Relative: 7 %
Neutro Abs: 2.6 10*3/uL (ref 1.7–7.7)
Neutrophils Relative %: 65 %
Platelet Count: 281 10*3/uL (ref 150–400)
RBC: 4.81 MIL/uL (ref 3.87–5.11)
RDW: 13 % (ref 11.5–15.5)
WBC Count: 4 10*3/uL (ref 4.0–10.5)
nRBC: 0 % (ref 0.0–0.2)

## 2019-02-02 LAB — CMP (CANCER CENTER ONLY)
ALT: 20 U/L (ref 0–44)
AST: 23 U/L (ref 15–41)
Albumin: 4.4 g/dL (ref 3.5–5.0)
Alkaline Phosphatase: 73 U/L (ref 38–126)
Anion gap: 10 (ref 5–15)
BUN: 13 mg/dL (ref 6–20)
CO2: 27 mmol/L (ref 22–32)
Calcium: 9.7 mg/dL (ref 8.9–10.3)
Chloride: 101 mmol/L (ref 98–111)
Creatinine: 0.96 mg/dL (ref 0.44–1.00)
GFR, Est AFR Am: >60 mL/min (ref >60)
GFR, Estimated: >60 mL/min (ref >60)
Glucose, Bld: 99 mg/dL (ref 70–99)
Potassium: 3.7 mmol/L (ref 3.5–5.1)
Sodium: 138 mmol/L (ref 135–145)
Total Bilirubin: 0.5 mg/dL (ref 0.3–1.2)
Total Protein: 7.9 g/dL (ref 6.5–8.1)

## 2019-02-02 LAB — FERRITIN: Ferritin: 45 ng/mL (ref 11–307)

## 2019-02-02 LAB — LACTATE DEHYDROGENASE: LDH: 147 U/L (ref 98–192)

## 2019-02-02 LAB — VITAMIN B12: Vitamin B-12: 734 pg/mL (ref 180–914)

## 2019-02-02 LAB — SEDIMENTATION RATE: Sed Rate: 12 mm/hr (ref 0–22)

## 2019-02-02 LAB — C-REACTIVE PROTEIN: CRP: 0.9 mg/dL (ref <1.0)

## 2019-02-02 LAB — FOLATE: Folate: 51 ng/mL (ref >5.9)

## 2019-02-02 NOTE — Progress Notes (Signed)
Referring physician:  Colin Benton, DO  Diagnosis Nutritional anemia - Plan: CBC with Differential ([place] Only), CMP ([place] only), Lactate dehydrogenase (LDH), Sedimentation rate, Ferritin, BCR ABL1 FISH ([facility]), Jak 2 V617F ([facility]), Jak 2 Exon 12 ([facility]), ANA, IFA (with reflex), Hepatitis B surface antibody, Hepatitis B surface antigen, Hepatitis B core antibody, total, Hepatitis C antibody, HIV antibody (with reflex), Rheumatoid factor, C-reactive protein, SPEP with reflex to IFE, Vitamin B12, Folate, Serum, Methylmalonic acid, serum  Other decreased white blood cell (WBC) count - Plan: CBC with Differential (Cancer Center Only), CMP ([place] only), Lactate dehydrogenase (LDH), Sedimentation rate, Ferritin, BCR ABL1 FISH ([facility]), Jak 2 V617F ([facility]), Jak 2 Exon 12 ([facility]), ANA, IFA (with reflex), Hepatitis B surface antibody, Hepatitis B surface antigen, Hepatitis B core antibody, total, Hepatitis C antibody, HIV antibody (with reflex), Rheumatoid factor, C-reactive protein, SPEP with reflex to IFE, Vitamin B12, Folate, Serum, Methylmalonic acid, serum  Thyroid cancer ([place]) - Plan: CBC with Differential ([place] Only), CMP ([place] only), Lactate dehydrogenase (LDH), Sedimentation rate, Ferritin, BCR ABL1 FISH ([facility]), Jak 2 V617F ([facility]), Jak 2 Exon 12 ([facility]), ANA, IFA (with reflex), Hepatitis B surface antibody, Hepatitis B surface antigen, Hepatitis B core antibody, total, Hepatitis C antibody, HIV antibody (with reflex), Rheumatoid factor, C-reactive protein, SPEP with reflex to IFE, Vitamin B12, Folate, Serum, Methylmalonic acid, serum  Screening for HIV without presence of risk factors - Plan: CBC with Differential (Cancer Center Only), CMP ([place] only), Lactate dehydrogenase (LDH), Sedimentation rate, Ferritin, BCR ABL1 FISH ([facility]), Jak 2 V617F ([facility]), Jak 2 Exon 12 ([facility]), ANA, IFA (with reflex), Hepatitis B surface antibody,  Hepatitis B surface antigen, Hepatitis B core antibody, total, Hepatitis C antibody, HIV antibody (with reflex), Rheumatoid factor, C-reactive protein, SPEP with reflex to IFE, Vitamin B12, Folate, Serum, Methylmalonic acid, serum  Staging Cancer Staging No matching staging information was found for the patient.  Assessment and Plan:  1.  Leukopenia.  53 year old female referred for evaluation due to leukopenia.  Pt was diagnosed with thyroid cancer 20 years ago.  She is followed by [facility]ology [facility].  She denies fevers, chills, night sweats and has noted no adenopathy.  She denies URI symptoms.  She reports recent increase in thyroid dose.  She has been on antidepressants since 2016.  Labs done 01/04/2019 showed WBC 3.2 HB 13.8 plts 310,000.  Previously labs done 11/29/2018 showed WBC 3.6 HB 13.5 plts 287,000.   Labs done 06/14/2018 showed WBC 4.4 HB 13.4 plts 276,000.  Pt denies family history of leukemias or lymphomas.  She denies smoking.  Pt is seen today for consultation due to leukopenia.   Labs done today 02/02/2019 WN with WBC 4 Hb 14.2 plts 281,000.  She has normal differential.  Ferritin WNL at 43.  Chemistries WNL with Cr 0.96, K+ 3.7 normal LFTs.  Normal B12, CRP. Awaiting  Results of BCR/ABL and Jak 2.  Suspect normal variant or may be related to medications.  Counts WNL on labs done today.  Pt reassured and will have phone visit follow-up to go over remaining results.   2.  Thyroid cancer.  Pt was diagnosed more than 20 years.  She is followed by endocrinology at [facility].  Continue follow-up as directed.    3.  HTN.  BP is 131/92. Follow-up with PCP as directed.    4.  Fibroid.  Pt had hysterectomy done 06/17/2017.  Pathology returned as  Uterus, cervix and bilateral fallopian tubes CERVIX:  CHRONIC INFLAMMATION. ENDOMETRIUM: INACTIVE-APPEARING ENDOMETRIUM. MYOMETRIUM:ADENOMYOSIS. - LEIOMYOMATA. SEROSA: ADHESIONS. BILATERAL FALLOPIAN TUBES: UNREMARKABLE.  HB WNL at 14.  Ferritin  WNL at 45.  Follow-up with GYN as recommended.    5.  Anxiety.  Pt is on Paxil.  Follow-up with PCP as directed.    6.  Family history of lung cancer.  Pt reports this occurred in a relative who smoked.  Pt denies smoking history.    7.  Health maintenance. Pt had colonoscopy done 01/07/2016 that showed polyps and internal hemorrhoids.  Pathology returned as  - TUBULAR ADENOMA AND HYPERPLASTIC POLYP. NO HIGH GRADE DYSPLASIA OR MALIGNANCY IDENTIFIED.  Follow-up with GI as recommended.  Mammograms as recommended per PCP.    40 minutes spent with more than 50% spent in review of records, counseling and coordination of care.    HPI:  53 year old female referred for evaluation due to leukopenia.  Pt was diagnosed with thyroid cancer 20 years ago.  She is followed by endocrinology At [facility].  She denies fevers, chills, night sweats and has noted no adenopathy.  She denies URI symptoms.  She reports recent increase in thyroid dose.  She has been on antidepressants since 2016.  Labs done 01/04/2019 showed WBC 3.2 HB 13.8 plts 310,000.  Previously labs done 11/29/2018 showed WBC 3.6 HB 13.5 plts 287,000.   Labs done 06/14/2018 showed WBC 4.4 HB 13.4 plts 276,000.  Pt denies family history of leukemias or lymphomas.  She denies smoking.  Pt is seen today for consultation due to leukopenia.   Problem List Patient Active Problem List   Diagnosis Date Noted  . S/P laparoscopic hysterectomy [Z90.710] 06/17/2017  . Essential hypertension [I10] 08/27/2015  . Hot flashes [R23.2] 07/27/2015  . GAD (generalized anxiety disorder) [F41.1] 07/27/2015  . Hypothyroidism [E03.9] 09/26/2013    Past Medical History Past Medical History:  Diagnosis Date  . Anemia    history of  . Chicken pox   . Essential hypertension 08/27/2015  . Frequent headaches    Migraines  . Hot flashes   . Hypothyroidism   . PONV (postoperative nausea and vomiting)   . S/P laparoscopic hysterectomy 06/17/2017  . Thyroid cancer ([place])  20 years ago   hypothyroidism, followed by endocrine  . UTI (urinary tract infection)     Past Surgical History Past Surgical History:  Procedure Laterality Date  . ABDOMINAL HYSTERECTOMY N/A 06/17/2017   Procedure: POSSIBLE HYSTERECTOMY ABDOMINAL;  Surgeon: Janyth Contes, MD;  Location: [facility];  Service: Gynecology;  Laterality: N/A;  . COLONOSCOPY    . HERNIA REPAIR    . LAPAROSCOPIC HYSTERECTOMY N/A 06/17/2017   Procedure: HYSTERECTOMY TOTAL LAPAROSCOPIC;  Surgeon: Janyth Contes, MD;  Location: [place];  Service: Gynecology;  Laterality: N/A;  MD needs 2.5hrs OR time  . SCALP REDUCTION     less than 54 years old  . THYROIDECTOMY  1995    Family History Family History  Problem Relation Age of Onset  . Hyperlipidemia Father   . Hypertension Mother   . Hyperlipidemia Brother   . Hypertension Brother   . Diabetes Maternal Grandmother   . Diabetes Maternal Aunt   . Colon cancer Neg Hx      Social History  reports that she has never smoked. She has never used smokeless tobacco. She reports current alcohol use. She reports that she does not use drugs.  Medications  Current Outpatient Medications:  .  hydrochlorothiazide (HYDRODIURIL) 25 MG tablet, TAKE 1 TABLET DAILY, Disp: 90 tablet,  Rfl: 1 .  levothyroxine (SYNTHROID) 25 MCG tablet, Take 1 tablet in AM on empty stomach 3 days a week for thyroid (name brand only DAW) [in addition to the 200 mcg daily], Disp: , Rfl:  .  Multiple Vitamins-Minerals (ONE-A-DAY WOMENS 50 PLUS PO), Take 1 tablet by mouth daily. , Disp: , Rfl:  .  PARoxetine (PAXIL) 20 MG tablet, TAKE 1 TABLET BY MOUTH EVERY DAY, Disp: 90 tablet, Rfl: 1 .  SYNTHROID 200 MCG tablet, Take 200 mcg by mouth daily before breakfast. , Disp: , Rfl:   Allergies Patient has no known allergies.  Review of Systems Review of Systems - Oncology ROS negative   Physical Exam  Vitals Wt Readings from Last 3 Encounters:  02/02/19 166 lb 12.8 oz (75.7 kg)   11/29/18 162 lb 14.4 oz (73.9 kg)  06/14/18 167 lb 9.6 oz (76 kg)   Temp Readings from Last 3 Encounters:  02/02/19 97.8 F (36.6 C) (Oral)  11/29/18 97.8 F (36.6 C) (Oral)  06/14/18 97.7 F (36.5 C) (Oral)   BP Readings from Last 3 Encounters:  02/02/19 (!) 131/92  11/29/18 102/70  06/14/18 118/76   Pulse Readings from Last 3 Encounters:  02/02/19 86  11/29/18 77  06/14/18 78   Constitutional: Well-developed, well-nourished, and in no distress.   HENT: Head: Normocephalic and atraumatic. Scar on neck from thyroid surgery.   Mouth/Throat: No oropharyngeal exudate. Mucosa moist. Eyes: Pupils are equal, round, and reactive to light. Conjunctivae are normal. No scleral icterus.  Neck: Normal range of motion. Neck supple. No JVD present.  Cardiovascular: Normal rate, regular rhythm and normal heart sounds.  Exam reveals no gallop and no friction rub.   No murmur heard. Pulmonary/Chest: Effort normal and breath sounds normal. No respiratory distress. No wheezes.No rales.  Abdominal: Soft. Bowel sounds are normal. No distension. There is no tenderness. There is no guarding.  Musculoskeletal: No edema or tenderness.  Lymphadenopathy: No cervical,axillary or supraclavicular adenopathy.  Neurological: Alert and oriented to person, place, and time. No cranial nerve deficit.  Skin: Skin is warm and dry. No rash noted. No erythema. No pallor.  Psychiatric: Affect and judgment normal.   Labs Appointment on 02/02/2019  Component Date Value Ref Range Status  . WBC Count 02/02/2019 4.0  4.0 - 10.5 K/uL Final  . RBC 02/02/2019 4.81  3.87 - 5.11 MIL/uL Final  . Hemoglobin 02/02/2019 14.2  12.0 - 15.0 g/dL Final  . HCT 02/02/2019 42.1  36.0 - 46.0 % Final  . MCV 02/02/2019 87.5  80.0 - 100.0 fL Final  . MCH 02/02/2019 29.5  26.0 - 34.0 pg Final  . MCHC 02/02/2019 33.7  30.0 - 36.0 g/dL Final  . RDW 02/02/2019 13.0  11.5 - 15.5 % Final  . Platelet Count 02/02/2019 281  150 - 400 K/uL  Final  . nRBC 02/02/2019 0.0  0.0 - 0.2 % Final  . Neutrophils Relative % 02/02/2019 65  % Final  . Neutro Abs 02/02/2019 2.6  1.7 - 7.7 K/uL Final  . Lymphocytes Relative 02/02/2019 26  % Final  . Lymphs Abs 02/02/2019 1.0  0.7 - 4.0 K/uL Final  . Monocytes Relative 02/02/2019 7  % Final  . Monocytes Absolute 02/02/2019 0.3  0.1 - 1.0 K/uL Final  . Eosinophils Relative 02/02/2019 1  % Final  . Eosinophils Absolute 02/02/2019 0.1  0.0 - 0.5 K/uL Final  . Basophils Relative 02/02/2019 1  % Final  . Basophils Absolute 02/02/2019 0.0  0.0 -  0.1 K/uL Final  . Immature Granulocytes 02/02/2019 0  % Final  . Abs Immature Granulocytes 02/02/2019 0.00  0.00 - 0.07 K/uL Final   Performed at [facility], [place] [address], [place], [place] [address]  . Sodium 02/02/2019 138  135 - 145 mmol/L Final  . Potassium 02/02/2019 3.7  3.5 - 5.1 mmol/L Final  . Chloride 02/02/2019 101  98 - 111 mmol/L Final  . CO2 02/02/2019 27  22 - 32 mmol/L Final  . Glucose, Bld 02/02/2019 99  70 - 99 mg/dL Final  . BUN 02/02/2019 13  6 - 20 mg/dL Final  . Creatinine 02/02/2019 0.96  0.44 - 1.00 mg/dL Final  . Calcium 02/02/2019 9.7  8.9 - 10.3 mg/dL Final  . Total Protein 02/02/2019 7.9  6.5 - 8.1 g/dL Final  . Albumin 02/02/2019 4.4  3.5 - 5.0 g/dL Final  . AST 02/02/2019 23  15 - 41 U/L Final  . ALT 02/02/2019 20  0 - 44 U/L Final  . Alkaline Phosphatase 02/02/2019 73  38 - 126 U/L Final  . Total Bilirubin 02/02/2019 0.5  0.3 - 1.2 mg/dL Final  . GFR, Est Non Af Am 02/02/2019 >60  >60 mL/min Final  . GFR, Est AFR Am 02/02/2019 >60  >60 mL/min Final  . Anion gap 02/02/2019 10  5 - 15 Final   Performed at [facility], [place] [address], [place], [place] [address]  . LDH 02/02/2019 147  98 - 192 U/L Final   Performed at [facility], [place] [address], [place], [place] [address]  . Sed Rate 02/02/2019 12  0 - 22 mm/hr Final   Performed at [facility], [place] [address], [place], [place] [address]  . Ferritin 02/02/2019 45  11 - 307 ng/mL Final   Performed at [facility], [place] [address], [place],  [place] [address]  . CRP 02/02/2019 0.9  <1.0 mg/dL Final   Performed at [place] [address], [place], [place] [address]     Pathology Orders Placed This Encounter  Procedures  . CBC with Differential (Cancer Center Only)    Standing Status:   Future    Number of Occurrences:   1    Standing Expiration Date:   02/02/2020  . CMP ([place] only)    Standing Status:   Future    Number of Occurrences:   1    Standing Expiration Date:   02/02/2020  . Lactate dehydrogenase (LDH)    Standing Status:   Future    Number of Occurrences:   1    Standing Expiration Date:   02/02/2020  . Sedimentation rate    Standing Status:   Future    Number of Occurrences:   1    Standing Expiration Date:   02/02/2020  . Ferritin    Standing Status:   Future    Number of Occurrences:   1    Standing Expiration Date:   02/02/2020  . BCR ABL1 FISH ([facility])    Standing Status:   Future    Number of Occurrences:   1    Standing Expiration Date:   02/02/2020  . Jak 2 V617F ([facility])    Standing Status:   Future    Number of Occurrences:   1    Standing Expiration Date:   02/02/2020  . Jak 2 Exon 12 ([facility])    Standing Status:  Future    Number of Occurrences:   1    Standing Expiration Date:   02/02/2020  . ANA, IFA (with reflex)    Standing Status:   Future    Number of Occurrences:   1    Standing Expiration Date:   02/02/2020  . Hepatitis B surface antibody    Standing Status:   Future    Number of Occurrences:   1    Standing Expiration Date:   02/02/2020  . Hepatitis B surface antigen    Standing Status:   Future    Number of Occurrences:   1    Standing Expiration Date:   02/02/2020  . Hepatitis B core antibody, total    Standing Status:   Future    Number of Occurrences:   1     Standing Expiration Date:   02/02/2020  . Hepatitis C antibody    Standing Status:   Future    Number of Occurrences:   1    Standing Expiration Date:   02/02/2020  . HIV antibody (with reflex)    Standing Status:   Future    Number of Occurrences:   1    Standing Expiration Date:   02/02/2020  . Rheumatoid factor    Standing Status:   Future    Number of Occurrences:   1    Standing Expiration Date:   02/02/2020  . C-reactive protein    Standing Status:   Future    Number of Occurrences:   1    Standing Expiration Date:   02/02/2020  . SPEP with reflex to IFE    Standing Status:   Future    Number of Occurrences:   1    Standing Expiration Date:   02/02/2020  . Vitamin B12    Standing Status:   Future    Number of Occurrences:   1    Standing Expiration Date:   02/02/2020  . Folate, Serum    Standing Status:   Future    Number of Occurrences:   1    Standing Expiration Date:   02/02/2020  . Methylmalonic acid, serum    Standing Status:   Future    Number of Occurrences:   1    Standing Expiration Date:   02/02/2020       Zoila Shutter MD

## 2019-02-03 DIAGNOSIS — E890 Postprocedural hypothyroidism: Secondary | ICD-10-CM | POA: Diagnosis not present

## 2019-02-03 DIAGNOSIS — Z85850 Personal history of malignant neoplasm of thyroid: Secondary | ICD-10-CM | POA: Diagnosis not present

## 2019-02-03 LAB — PROTEIN ELECTROPHORESIS, SERUM, WITH REFLEX
A/G Ratio: 1.4 (ref 0.7–1.7)
Albumin ELP: 4.2 g/dL (ref 2.9–4.4)
Alpha-1-Globulin: 0.2 g/dL (ref 0.0–0.4)
Alpha-2-Globulin: 0.8 g/dL (ref 0.4–1.0)
Beta Globulin: 1 g/dL (ref 0.7–1.3)
Gamma Globulin: 0.9 g/dL (ref 0.4–1.8)
Globulin, Total: 2.9 g/dL (ref 2.2–3.9)
Total Protein ELP: 7.1 g/dL (ref 6.0–8.5)

## 2019-02-03 LAB — HEPATITIS B SURFACE ANTIBODY,QUALITATIVE: Hep B S Ab: NONREACTIVE

## 2019-02-03 LAB — HEPATITIS B SURFACE ANTIGEN: Hepatitis B Surface Ag: NEGATIVE

## 2019-02-03 LAB — RHEUMATOID FACTOR: Rheumatoid fact SerPl-aCnc: 10.7 IU/mL (ref 0.0–13.9)

## 2019-02-03 LAB — HEPATITIS C ANTIBODY: HCV Ab: <0.1 s/co ratio (ref 0.0–0.9)

## 2019-02-03 LAB — HIV ANTIBODY (ROUTINE TESTING W REFLEX): HIV Screen 4th Generation wRfx: NONREACTIVE

## 2019-02-03 LAB — HEPATITIS B CORE ANTIBODY, TOTAL: Hep B Core Total Ab: NEGATIVE

## 2019-02-03 LAB — ANTINUCLEAR ANTIBODIES, IFA: ANA Ab, IFA: NEGATIVE

## 2019-02-04 ENCOUNTER — Telehealth: Payer: Self-pay | Admitting: Internal Medicine

## 2019-02-04 LAB — METHYLMALONIC ACID, SERUM: Methylmalonic Acid, Quantitative: 115 nmol/L (ref 0–378)

## 2019-02-04 NOTE — Telephone Encounter (Signed)
Tried to reach regarding 4/23

## 2019-02-08 DIAGNOSIS — E890 Postprocedural hypothyroidism: Secondary | ICD-10-CM | POA: Diagnosis not present

## 2019-02-08 DIAGNOSIS — Z85850 Personal history of malignant neoplasm of thyroid: Secondary | ICD-10-CM | POA: Diagnosis not present

## 2019-02-15 DIAGNOSIS — H40021 Open angle with borderline findings, high risk, right eye: Secondary | ICD-10-CM | POA: Diagnosis not present

## 2019-02-17 ENCOUNTER — Inpatient Hospital Stay (HOSPITAL_BASED_OUTPATIENT_CLINIC_OR_DEPARTMENT_OTHER): Payer: BLUE CROSS/BLUE SHIELD | Admitting: Internal Medicine

## 2019-02-17 DIAGNOSIS — Z801 Family history of malignant neoplasm of trachea, bronchus and lung: Secondary | ICD-10-CM

## 2019-02-17 DIAGNOSIS — F419 Anxiety disorder, unspecified: Secondary | ICD-10-CM | POA: Diagnosis not present

## 2019-02-17 DIAGNOSIS — D72818 Other decreased white blood cell count: Secondary | ICD-10-CM | POA: Diagnosis not present

## 2019-02-17 DIAGNOSIS — I10 Essential (primary) hypertension: Secondary | ICD-10-CM | POA: Diagnosis not present

## 2019-02-17 DIAGNOSIS — Z79899 Other long term (current) drug therapy: Secondary | ICD-10-CM

## 2019-02-17 DIAGNOSIS — Z85850 Personal history of malignant neoplasm of thyroid: Secondary | ICD-10-CM | POA: Diagnosis not present

## 2019-02-17 NOTE — Progress Notes (Signed)
Virtual Visit via Telephone Note  I connected with Chelsea Frye on 02/17/19 at  9:30 AM EDT by telephone and verified that I am speaking with the correct person using two identifiers.   I discussed the limitations, risks, security and privacy concerns of performing an evaluation and management service by telephone and the availability of in person appointments. I also discussed with the patient that there may be a patient responsible charge related to this service. The patient expressed understanding and agreed to proceed.  Interval History:  Historical data obtained from note dated 02/02/2019.  53 year old female referred for evaluation due to leukopenia.  Pt was diagnosed with thyroid cancer 20 years ago.  She is followed by endocrinology At [facility].  She denies fevers, chills, night sweats and has noted no adenopathy.  She denies URI symptoms.  She reports recent increase in thyroid dose.  She has been on antidepressants since 2016.  Labs done 01/04/2019 showed WBC 3.2 HB 13.8 plts 310,000.  Previously labs done 11/29/2018 showed WBC 3.6 HB 13.5 plts 287,000.   Labs done 06/14/2018 showed WBC 4.4 HB 13.4 plts 276,000.  Pt denies family history of leukemias or lymphomas.  She denies smoking.    Observations/Objective: Review of labs dated 02/02/2019.    Assessment and Plan:   1.  Leukopenia.  53 year old female referred for evaluation due to leukopenia.  Pt was diagnosed with thyroid cancer 20 years ago.  She is followed by endocrinology at [facility].  She denies fevers, chills, night sweats and has noted no adenopathy.  She denies URI symptoms.  She reports recent increase in thyroid dose.  She has been on antidepressants since 2016.  Labs done 01/04/2019 showed WBC 3.2 HB 13.8 plts 310,000.  Previously labs done 11/29/2018 showed WBC 3.6 HB 13.5 plts 287,000.  Labs done 06/14/2018 showed WBC 4.4 HB 13.4 plts 276,000.  Pt denies family history of leukemias or lymphomas.  She denies smoking.    Labs done 02/02/2019  reviewed and WNL with WBC 4 Hb 14.2 plts 281,000.  She has normal differential.  Ferritin WNL at 43.  Chemistries WNL with Cr 0.96, K+ 3.7 normal LFTs.  Normal B12, CRP, sed rate. Negative BCR/ABL and Jak 2.  Normal Hepatitis panel and HIV.  She has negative SPEP, ANA, RF.  Counts WNL.  Pt reassured and is assigned to as needed follow-up. Happy to see pt in future if any change in lab studies.    2.  Thyroid cancer.  Pt was diagnosed more than 20 years.  She is followed by endocrinology at [facility].  Continue follow-up as directed.    3.  HTN.  BP was 131/92. Follow-up with PCP as directed.    4.  Fibroid.  Pt had hysterectomy done 06/17/2017.  Pathology returned as  Uterus, cervix and bilateral fallopian tubes CERVIX: CHRONIC INFLAMMATION. ENDOMETRIUM: INACTIVE-APPEARING ENDOMETRIUM. MYOMETRIUM:ADENOMYOSIS. - LEIOMYOMATA. SEROSA: ADHESIONS. BILATERAL FALLOPIAN TUBES: UNREMARKABLE.  HB WNL at 14.  Ferritin WNL at 45.  Follow-up with GYN as recommended.    5.  Anxiety.  Pt is on Paxil.  Follow-up with PCP as directed.    6.  Family history of lung cancer.  Pt reports this occurred in a relative who smoked.  Pt denies smoking history.    7.  Health maintenance. Pt had colonoscopy done 01/07/2016 that showed polyps and internal hemorrhoids.  Pathology returned as  - TUBULAR ADENOMA AND HYPERPLASTIC POLYP. NO HIGH GRADE DYSPLASIA OR MALIGNANCY IDENTIFIED.  Follow-up with  GI as recommended.  Mammograms as recommended per PCP.     Follow Up Instructions: No follow-up needed.  Happy to see pt in future if any change in blood work per PCP.      I discussed the assessment and treatment plan with the patient. The patient was provided an opportunity to ask questions and all were answered. The patient agreed with the plan and demonstrated an understanding of the instructions.   The patient was advised to call back or seek an in-person evaluation if the symptoms worsen or if the condition fails to improve  as anticipated.  I provided 15 minutes of non-face-to-face time during this encounter.   Zoila Shutter, MD

## 2019-02-18 LAB — BCR ABL1 FISH (GENPATH)

## 2019-02-18 LAB — JAK 2 EXON 12 (GENPATH)

## 2019-02-18 LAB — JAK 2 V617F (GENPATH)

## 2019-02-27 ENCOUNTER — Other Ambulatory Visit: Payer: Self-pay | Admitting: Family Medicine

## 2019-03-03 DIAGNOSIS — H40022 Open angle with borderline findings, high risk, left eye: Secondary | ICD-10-CM | POA: Diagnosis not present

## 2019-04-14 DIAGNOSIS — H2513 Age-related nuclear cataract, bilateral: Secondary | ICD-10-CM | POA: Diagnosis not present

## 2019-04-14 DIAGNOSIS — H40023 Open angle with borderline findings, high risk, bilateral: Secondary | ICD-10-CM | POA: Diagnosis not present

## 2019-06-06 ENCOUNTER — Ambulatory Visit: Payer: BLUE CROSS/BLUE SHIELD | Admitting: Family Medicine

## 2019-06-07 ENCOUNTER — Other Ambulatory Visit: Payer: Self-pay | Admitting: Family Medicine

## 2019-06-15 ENCOUNTER — Ambulatory Visit: Payer: BLUE CROSS/BLUE SHIELD | Admitting: Internal Medicine

## 2019-07-13 ENCOUNTER — Ambulatory Visit: Payer: BC Managed Care – PPO | Admitting: Internal Medicine

## 2019-07-13 ENCOUNTER — Encounter: Payer: Self-pay | Admitting: Internal Medicine

## 2019-07-13 VITALS — BP 110/80 | HR 86 | Temp 97.7°F | Ht 67.0 in | Wt 163.4 lb

## 2019-07-13 DIAGNOSIS — Z23 Encounter for immunization: Secondary | ICD-10-CM | POA: Diagnosis not present

## 2019-07-13 DIAGNOSIS — E038 Other specified hypothyroidism: Secondary | ICD-10-CM

## 2019-07-13 DIAGNOSIS — I10 Essential (primary) hypertension: Secondary | ICD-10-CM | POA: Diagnosis not present

## 2019-07-13 DIAGNOSIS — R232 Flushing: Secondary | ICD-10-CM | POA: Diagnosis not present

## 2019-07-13 NOTE — Addendum Note (Signed)
Addended by: Westley Hummer B on: 07/13/2019 04:46 PM   Modules accepted: Orders

## 2019-07-13 NOTE — Progress Notes (Signed)
Established Patient Office Visit     CC/Reason for Visit: Establish care, follow-up chronic conditions  HPI: Chelsea Frye is a 53 y.o. female who is coming in today for the above mentioned reasons. Past Medical History is significant for: Well-controlled hypertension on hydrochlorothiazide, iatrogenic hypothyroidism status post thyroidectomy for thyroid cancer on levothyroxine followed by endocrinology, hot flashes well-controlled on Paxil.  She has no acute complaints today.  She is willing to receive a flu vaccine and is requesting shingles vaccination today.   Past Medical/Surgical History: Past Medical History:  Diagnosis Date  . Anemia    history of  . Chicken pox   . Essential hypertension 08/27/2015  . Frequent headaches    Migraines  . Hot flashes   . Hypothyroidism   . PONV (postoperative nausea and vomiting)   . S/P laparoscopic hysterectomy 06/17/2017  . Thyroid cancer ([place]) 20 years ago   hypothyroidism, followed by endocrine  . UTI (urinary tract infection)     Past Surgical History:  Procedure Laterality Date  . ABDOMINAL HYSTERECTOMY N/A 06/17/2017   Procedure: POSSIBLE HYSTERECTOMY ABDOMINAL;  Surgeon: Janyth Contes, MD;  Location: [facility];  Service: Gynecology;  Laterality: N/A;  . COLONOSCOPY    . HERNIA REPAIR    . LAPAROSCOPIC HYSTERECTOMY N/A 06/17/2017   Procedure: HYSTERECTOMY TOTAL LAPAROSCOPIC;  Surgeon: Janyth Contes, MD;  Location: [facility];  Service: Gynecology;  Laterality: N/A;  MD needs 2.5hrs OR time  . SCALP REDUCTION     less than 95 years old  . THYROIDECTOMY  1995    Social History:  reports that she has never smoked. She has never used smokeless tobacco. She reports current alcohol use. She reports that she does not use drugs.  Allergies: No Known Allergies  Family History:  Family History  Problem Relation Age of Onset  . Hyperlipidemia Father   . Hypertension Mother   . Hyperlipidemia Brother   . Hypertension  Brother   . Diabetes Maternal Grandmother   . Diabetes Maternal Aunt   . Colon cancer Neg Hx      Current Outpatient Medications:  .  hydrochlorothiazide (HYDRODIURIL) 25 MG tablet, TAKE 1 TABLET DAILY, Disp: 90 tablet, Rfl: 0 .  levothyroxine (SYNTHROID) 25 MCG tablet, Take 1 tablet in AM on empty stomach 3 days a week for thyroid (name brand only DAW) [in addition to the 200 mcg daily], Disp: , Rfl:  .  Multiple Vitamins-Minerals (ONE-A-DAY WOMENS 50 PLUS PO), Take 1 tablet by mouth daily. , Disp: , Rfl:  .  PARoxetine (PAXIL) 20 MG tablet, TAKE 1 TABLET BY MOUTH EVERY DAY, Disp: 90 tablet, Rfl: 1 .  SYNTHROID 200 MCG tablet, Take 200 mcg by mouth daily before breakfast. , Disp: , Rfl:   Review of Systems:  Constitutional: Denies fever, chills, diaphoresis, appetite change and fatigue.  HEENT: Denies photophobia, eye pain, redness, hearing loss, ear pain, congestion, sore throat, rhinorrhea, sneezing, mouth sores, trouble swallowing, neck pain, neck stiffness and tinnitus.   Respiratory: Denies SOB, DOE, cough, chest tightness,  and wheezing.   Cardiovascular: Denies chest pain, palpitations and leg swelling.  Gastrointestinal: Denies nausea, vomiting, abdominal pain, diarrhea, constipation, blood in stool and abdominal distention.  Genitourinary: Denies dysuria, urgency, frequency, hematuria, flank pain and difficulty urinating.  Endocrine: Denies: hot or cold intolerance, sweats, changes in hair or nails, polyuria, polydipsia. Musculoskeletal: Denies myalgias, back pain, joint swelling, arthralgias and gait problem.  Skin: Denies pallor, rash and wound.  Neurological: Denies dizziness,  seizures, syncope, weakness, light-headedness, numbness and headaches.  Hematological: Denies adenopathy. Easy bruising, personal or family bleeding history  Psychiatric/Behavioral: Denies suicidal ideation, mood changes, confusion, nervousness, sleep disturbance and agitation    Physical Exam:  Vitals:   07/13/19 0724  BP: 110/80  Pulse: 86  Temp: 97.7 F (36.5 C)  TempSrc: Temporal  SpO2: 96%  Weight: 163 lb 6.4 oz (74.1 kg)  Height: 5\' 7"  (1.702 m)    Body mass index is 25.59 kg/m.   Constitutional: NAD, calm, comfortable Eyes: PERRL, lids and conjunctivae normal ENMT: Mucous membranes are moist.  Neck: normal, supple, no masses, no thyromegaly Respiratory: clear to auscultation bilaterally, no wheezing, no crackles. Normal respiratory effort. No accessory muscle use.  Cardiovascular: Regular rate and rhythm, no murmurs / rubs / gallops. No extremity edema. 2+ pedal pulses. No carotid bruits.  Abdomen: no tenderness, no masses palpated. No hepatosplenomegaly. Bowel sounds positive.  Musculoskeletal: no clubbing / cyanosis. No joint deformity upper and lower extremities. Good ROM, no contractures. Normal muscle tone.  Skin: no rashes, lesions, ulcers. No induration Neurologic: Grossly intact and nonfocal  Psychiatric: Normal judgment and insight. Alert and oriented x 3. Normal mood.    Impression and Plan:  Other specified hypothyroidism -Last TSH that I have on record was 1.63 in 2017, however she states that she follows routinely with endocrinology.  Essential hypertension -Well-controlled on hydrochlorothiazide.  Hot flashes -Well-controlled on Paxil.    Patient Instructions  -Nice seeing you today!!  -Flu and 1 of 2 shingles vaccines today.  -Schedule follow up in 6 months for your physical.       Lelon Frohlich, MD [facility] at [facility]

## 2019-07-13 NOTE — Patient Instructions (Signed)
-  Nice seeing you today!!  -Flu and 1 of 2 shingles vaccines today.  -Schedule follow up in 6 months for your physical.

## 2019-08-01 DIAGNOSIS — H40023 Open angle with borderline findings, high risk, bilateral: Secondary | ICD-10-CM | POA: Diagnosis not present

## 2019-08-11 ENCOUNTER — Other Ambulatory Visit: Payer: Self-pay | Admitting: Family Medicine

## 2019-08-11 DIAGNOSIS — Z85850 Personal history of malignant neoplasm of thyroid: Secondary | ICD-10-CM | POA: Diagnosis not present

## 2019-08-11 DIAGNOSIS — E890 Postprocedural hypothyroidism: Secondary | ICD-10-CM | POA: Diagnosis not present

## 2019-08-16 DIAGNOSIS — E890 Postprocedural hypothyroidism: Secondary | ICD-10-CM | POA: Diagnosis not present

## 2019-08-16 DIAGNOSIS — Z85850 Personal history of malignant neoplasm of thyroid: Secondary | ICD-10-CM | POA: Diagnosis not present

## 2019-09-03 ENCOUNTER — Other Ambulatory Visit: Payer: Self-pay | Admitting: Family Medicine

## 2019-09-12 ENCOUNTER — Ambulatory Visit (INDEPENDENT_AMBULATORY_CARE_PROVIDER_SITE_OTHER): Payer: BC Managed Care – PPO

## 2019-09-12 ENCOUNTER — Other Ambulatory Visit: Payer: Self-pay

## 2019-09-12 DIAGNOSIS — Z23 Encounter for immunization: Secondary | ICD-10-CM

## 2019-09-12 NOTE — Progress Notes (Signed)
Patient was given 2nd Shingles vaccine in left deltoid and tolerated well.

## 2019-10-28 DIAGNOSIS — H269 Unspecified cataract: Secondary | ICD-10-CM

## 2019-10-28 HISTORY — DX: Unspecified cataract: H26.9

## 2019-11-03 ENCOUNTER — Other Ambulatory Visit: Payer: Self-pay | Admitting: Internal Medicine

## 2019-11-11 LAB — HM MAMMOGRAPHY: HM Mammogram: NORMAL (ref 0–4)

## 2019-12-01 ENCOUNTER — Encounter: Payer: BC Managed Care – PPO | Admitting: Internal Medicine

## 2019-12-03 ENCOUNTER — Other Ambulatory Visit: Payer: Self-pay | Admitting: Internal Medicine

## 2020-01-20 ENCOUNTER — Encounter: Payer: Self-pay | Admitting: Internal Medicine

## 2020-01-25 ENCOUNTER — Other Ambulatory Visit: Payer: Self-pay | Admitting: Internal Medicine

## 2020-01-31 ENCOUNTER — Other Ambulatory Visit: Payer: Self-pay

## 2020-02-01 ENCOUNTER — Ambulatory Visit (INDEPENDENT_AMBULATORY_CARE_PROVIDER_SITE_OTHER): Payer: 59 | Admitting: Internal Medicine

## 2020-02-01 ENCOUNTER — Encounter: Payer: Self-pay | Admitting: Internal Medicine

## 2020-02-01 VITALS — BP 102/68 | HR 77 | Temp 97.2°F | Ht 67.0 in | Wt 159.3 lb

## 2020-02-01 DIAGNOSIS — I10 Essential (primary) hypertension: Secondary | ICD-10-CM

## 2020-02-01 DIAGNOSIS — Z0000 Encounter for general adult medical examination without abnormal findings: Secondary | ICD-10-CM | POA: Diagnosis not present

## 2020-02-01 DIAGNOSIS — E038 Other specified hypothyroidism: Secondary | ICD-10-CM

## 2020-02-01 DIAGNOSIS — F411 Generalized anxiety disorder: Secondary | ICD-10-CM | POA: Diagnosis not present

## 2020-02-01 LAB — COMPREHENSIVE METABOLIC PANEL
ALT: 18 U/L (ref 0–35)
AST: 18 U/L (ref 0–37)
Albumin: 4.7 g/dL (ref 3.5–5.2)
Alkaline Phosphatase: 73 U/L (ref 39–117)
BUN: 17 mg/dL (ref 6–23)
CO2: 31 mEq/L (ref 19–32)
Calcium: 9.8 mg/dL (ref 8.4–10.5)
Chloride: 100 mEq/L (ref 96–112)
Creatinine, Ser: 0.86 mg/dL (ref 0.40–1.20)
GFR: 68.55 mL/min (ref >60.00)
Glucose, Bld: 96 mg/dL (ref 70–99)
Potassium: 3.9 mEq/L (ref 3.5–5.1)
Sodium: 139 mEq/L (ref 135–145)
Total Bilirubin: 0.6 mg/dL (ref 0.2–1.2)
Total Protein: 7.2 g/dL (ref 6.0–8.3)

## 2020-02-01 LAB — LIPID PANEL
Cholesterol: 242 mg/dL — ABNORMAL HIGH (ref 0–200)
HDL: 67.6 mg/dL (ref >39.00)
NonHDL: 173.91
Total CHOL/HDL Ratio: 4
Triglycerides: 247 mg/dL — ABNORMAL HIGH (ref 0.0–149.0)
VLDL: 49.4 mg/dL — ABNORMAL HIGH (ref 0.0–40.0)

## 2020-02-01 LAB — CBC WITH DIFFERENTIAL/PLATELET
Basophils Absolute: 0 10*3/uL (ref 0.0–0.1)
Basophils Relative: 0.5 % (ref 0.0–3.0)
Eosinophils Absolute: 0.1 10*3/uL (ref 0.0–0.7)
Eosinophils Relative: 1.5 % (ref 0.0–5.0)
HCT: 40.6 % (ref 36.0–46.0)
Hemoglobin: 14 g/dL (ref 12.0–15.0)
Lymphocytes Relative: 28.9 % (ref 12.0–46.0)
Lymphs Abs: 1.1 10*3/uL (ref 0.7–4.0)
MCHC: 34.4 g/dL (ref 30.0–36.0)
MCV: 86.7 fl (ref 78.0–100.0)
Monocytes Absolute: 0.3 10*3/uL (ref 0.1–1.0)
Monocytes Relative: 7.9 % (ref 3.0–12.0)
Neutro Abs: 2.3 10*3/uL (ref 1.4–7.7)
Neutrophils Relative %: 61.2 % (ref 43.0–77.0)
Platelets: 278 10*3/uL (ref 150.0–400.0)
RBC: 4.69 Mil/uL (ref 3.87–5.11)
RDW: 13.4 % (ref 11.5–15.5)
WBC: 3.8 10*3/uL — ABNORMAL LOW (ref 4.0–10.5)

## 2020-02-01 LAB — LDL CHOLESTEROL, DIRECT: Direct LDL: 139 mg/dL

## 2020-02-01 LAB — VITAMIN B12: Vitamin B-12: 600 pg/mL (ref 211–911)

## 2020-02-01 LAB — HEMOGLOBIN A1C: Hgb A1c MFr Bld: 5.5 % (ref 4.6–6.5)

## 2020-02-01 LAB — VITAMIN D 25 HYDROXY (VIT D DEFICIENCY, FRACTURES): VITD: 50.62 ng/mL (ref 30.00–100.00)

## 2020-02-01 NOTE — Patient Instructions (Signed)
-Nice seeing you today!!  -Lab work today; will notify you once results are available.  -Schedule follow up in 6 months.   Preventive Care 40-55 Years Old, Female Preventive care refers to visits with your health care provider and lifestyle choices that can promote health and wellness. This includes:  A yearly physical exam. This may also be called an annual well check.  Regular dental visits and eye exams.  Immunizations.  Screening for certain conditions.  Healthy lifestyle choices, such as eating a healthy diet, getting regular exercise, not using drugs or products that contain nicotine and tobacco, and limiting alcohol use. What can I expect for my preventive care visit? Physical exam Your health care provider will check your:  Height and weight. This may be used to calculate body mass index (BMI), which tells if you are at a healthy weight.  Heart rate and blood pressure.  Skin for abnormal spots. Counseling Your health care provider may ask you questions about your:  Alcohol, tobacco, and drug use.  Emotional well-being.  Home and relationship well-being.  Sexual activity.  Eating habits.  Work and work environment.  Method of birth control.  Menstrual cycle.  Pregnancy history. What immunizations do I need?  Influenza (flu) vaccine  This is recommended every year. Tetanus, diphtheria, and pertussis (Tdap) vaccine  You may need a Td booster every 10 years. Varicella (chickenpox) vaccine  You may need this if you have not been vaccinated. Zoster (shingles) vaccine  You may need this after age 60. Measles, mumps, and rubella (MMR) vaccine  You may need at least one dose of MMR if you were born in 1957 or later. You may also need a second dose. Pneumococcal conjugate (PCV13) vaccine  You may need this if you have certain conditions and were not previously vaccinated. Pneumococcal polysaccharide (PPSV23) vaccine  You may need one or two doses if  you smoke cigarettes or if you have certain conditions. Meningococcal conjugate (MenACWY) vaccine  You may need this if you have certain conditions. Hepatitis A vaccine  You may need this if you have certain conditions or if you travel or work in places where you may be exposed to hepatitis A. Hepatitis B vaccine  You may need this if you have certain conditions or if you travel or work in places where you may be exposed to hepatitis B. Haemophilus influenzae type b (Hib) vaccine  You may need this if you have certain conditions. Human papillomavirus (HPV) vaccine  If recommended by your health care provider, you may need three doses over 6 months. You may receive vaccines as individual doses or as more than one vaccine together in one shot (combination vaccines). Talk with your health care provider about the risks and benefits of combination vaccines. What tests do I need? Blood tests  Lipid and cholesterol levels. These may be checked every 5 years, or more frequently if you are over 50 years old.  Hepatitis C test.  Hepatitis B test. Screening  Lung cancer screening. You may have this screening every year starting at age 55 if you have a 30-pack-year history of smoking and currently smoke or have quit within the past 15 years.  Colorectal cancer screening. All adults should have this screening starting at age 50 and continuing until age 75. Your health care provider may recommend screening at age 45 if you are at increased risk. You will have tests every 1-10 years, depending on your results and the type of screening test.    Diabetes screening. This is done by checking your blood sugar (glucose) after you have not eaten for a while (fasting). You may have this done every 1-3 years.  Mammogram. This may be done every 1-2 years. Talk with your health care provider about when you should start having regular mammograms. This may depend on whether you have a family history of breast  cancer.  BRCA-related cancer screening. This may be done if you have a family history of breast, ovarian, tubal, or peritoneal cancers.  Pelvic exam and Pap test. This may be done every 3 years starting at age 21. Starting at age 30, this may be done every 5 years if you have a Pap test in combination with an HPV test. Other tests  Sexually transmitted disease (STD) testing.  Bone density scan. This is done to screen for osteoporosis. You may have this scan if you are at high risk for osteoporosis. Follow these instructions at home: Eating and drinking  Eat a diet that includes fresh fruits and vegetables, whole grains, lean protein, and low-fat dairy.  Take vitamin and mineral supplements as recommended by your health care provider.  Do not drink alcohol if: ? Your health care provider tells you not to drink. ? You are pregnant, may be pregnant, or are planning to become pregnant.  If you drink alcohol: ? Limit how much you have to 0-1 drink a day. ? Be aware of how much alcohol is in your drink. In the [place] one drink equals one 12 oz bottle of beer (355 mL), one 5 oz glass of wine (148 mL), or one 1 oz glass of hard liquor (44 mL). Lifestyle  Take daily care of your teeth and gums.  Stay active. Exercise for at least 30 minutes on 5 or more days each week.  Do not use any products that contain nicotine or tobacco, such as cigarettes, e-cigarettes, and chewing tobacco. If you need help quitting, ask your health care provider.  If you are sexually active, practice safe sex. Use a condom or other form of birth control (contraception) in order to prevent pregnancy and STIs (sexually transmitted infections).  If told by your health care provider, take low-dose aspirin daily starting at age 50. What's next?  Visit your health care provider once a year for a well check visit.  Ask your health care provider how often you should have your eyes and teeth checked.  Stay up to date  on all vaccines. This information is not intended to replace advice given to you by your health care provider. Make sure you discuss any questions you have with your health care provider. Document Revised: 06/24/2018 Document Reviewed: 06/24/2018 Elsevier Patient Education  2020 [employer]  

## 2020-02-01 NOTE — Addendum Note (Signed)
Addended by: Elmer Picker on: 02/01/2020 09:25 AM   Modules accepted: Orders

## 2020-02-01 NOTE — Progress Notes (Signed)
Established Patient Office Visit     This visit occurred during the SARS-CoV-2 public health emergency.  Safety protocols were in place, including screening questions prior to the visit, additional usage of staff PPE, and extensive cleaning of exam room while observing appropriate contact time as indicated for disinfecting solutions.    CC/Reason for Visit: Annual preventive exam  HPI: Chelsea Frye is a 54 y.o. female who is coming in today for the above mentioned reasons. Past Medical History is significant for: Hypertension well-controlled, history of iatrogenic hypothyroidism following total thyroidectomy for thyroid cancer many years ago followed by endocrinology, hot flashes on Paxil.  She has routine eye and dental care, she exercises daily, she had her first Covid vaccine.  She had a normal mammogram in January of this year, had a colonoscopy in 2017 and is a 5-year callback.  She follows with GYN.  She has no complaints today.   Past Medical/Surgical History: Past Medical History:  Diagnosis Date  . Anemia    history of  . Chicken pox   . Essential hypertension 08/27/2015  . Frequent headaches    Migraines  . Hot flashes   . Hypothyroidism   . PONV (postoperative nausea and vomiting)   . S/P laparoscopic hysterectomy 06/17/2017  . Thyroid cancer ([place]) 20 years ago   hypothyroidism, followed by endocrine  . UTI (urinary tract infection)     Past Surgical History:  Procedure Laterality Date  . ABDOMINAL HYSTERECTOMY N/A 06/17/2017   Procedure: POSSIBLE HYSTERECTOMY ABDOMINAL;  Surgeon: Janyth Contes, MD;  Location: [facility];  Service: Gynecology;  Laterality: N/A;  . COLONOSCOPY    . HERNIA REPAIR    . LAPAROSCOPIC HYSTERECTOMY N/A 06/17/2017   Procedure: HYSTERECTOMY TOTAL LAPAROSCOPIC;  Surgeon: Janyth Contes, MD;  Location: [facility];  Service: Gynecology;  Laterality: N/A;  MD needs 2.5hrs OR time  . SCALP REDUCTION     less than 71 years old  .  THYROIDECTOMY  1995    Social History:  reports that she has never smoked. She has never used smokeless tobacco. She reports current alcohol use. She reports that she does not use drugs.  Allergies: No Known Allergies  Family History:  Family History  Problem Relation Age of Onset  . Hyperlipidemia Father   . Hypertension Mother   . Hyperlipidemia Brother   . Hypertension Brother   . Diabetes Maternal Grandmother   . Diabetes Maternal Aunt   . Colon cancer Neg Hx      Current Outpatient Medications:  .  hydrochlorothiazide (HYDRODIURIL) 25 MG tablet, TAKE 1 TABLET DAILY, Disp: 90 tablet, Rfl: 1 .  levothyroxine (SYNTHROID) 25 MCG tablet, Take 1 tablet in AM on empty stomach 3 days a week for thyroid (name brand only DAW) [in addition to the 200 mcg daily], Disp: , Rfl:  .  Multiple Vitamins-Minerals (ONE-A-DAY WOMENS 50 PLUS PO), Take 1 tablet by mouth daily. , Disp: , Rfl:  .  PARoxetine (PAXIL) 20 MG tablet, TAKE 1 TABLET BY MOUTH EVERY DAY, Disp: 90 tablet, Rfl: 0 .  SYNTHROID 200 MCG tablet, Take 200 mcg by mouth daily before breakfast. , Disp: , Rfl:   Review of Systems:  Constitutional: Denies fever, chills, diaphoresis, appetite change and fatigue.  HEENT: Denies photophobia, eye pain, redness, hearing loss, ear pain, congestion, sore throat, rhinorrhea, sneezing, mouth sores, trouble swallowing, neck pain, neck stiffness and tinnitus.   Respiratory: Denies SOB, DOE, cough, chest tightness,  and wheezing.   Cardiovascular:  Denies chest pain, palpitations and leg swelling.  Gastrointestinal: Denies nausea, vomiting, abdominal pain, diarrhea, constipation, blood in stool and abdominal distention.  Genitourinary: Denies dysuria, urgency, frequency, hematuria, flank pain and difficulty urinating.  Endocrine: Denies: hot or cold intolerance, sweats, changes in hair or nails, polyuria, polydipsia. Musculoskeletal: Denies myalgias, back pain, joint swelling, arthralgias and gait  problem.  Skin: Denies pallor, rash and wound.  Neurological: Denies dizziness, seizures, syncope, weakness, light-headedness, numbness and headaches.  Hematological: Denies adenopathy. Easy bruising, personal or family bleeding history  Psychiatric/Behavioral: Denies suicidal ideation, mood changes, confusion, nervousness, sleep disturbance and agitation    Physical Exam: Vitals:   02/01/20 0842  BP: 102/68  Pulse: 77  Temp: (!) 97.2 F (36.2 C)  TempSrc: Temporal  SpO2: 97%  Weight: 159 lb 4.8 oz (72.3 kg)  Height: '5\' 7"'  (1.702 m)    Body mass index is 24.95 kg/m.   Constitutional: NAD, calm, comfortable Eyes: PERRL, lids and conjunctivae normal ENMT: Mucous membranes are moist. Tympanic membrane is pearly white, no erythema or bulging. Neck: normal, supple, no masses, no thyromegaly Respiratory: clear to auscultation bilaterally, no wheezing, no crackles. Normal respiratory effort. No accessory muscle use.  Cardiovascular: Regular rate and rhythm, no murmurs / rubs / gallops. No extremity edema. 2+ pedal pulses. No carotid bruits.  Abdomen: no tenderness, no masses palpated. No hepatosplenomegaly. Bowel sounds positive.  Musculoskeletal: no clubbing / cyanosis. No joint deformity upper and lower extremities. Good ROM, no contractures. Normal muscle tone.  Skin: no rashes, lesions, ulcers. No induration Neurologic: CN 2-12 grossly intact. Sensation intact, DTR normal. Strength 5/5 in all 4.  Psychiatric: Normal judgment and insight. Alert and oriented x 3. Normal mood.    Impression and Plan:  Encounter for preventive health examination -She has routine eye and dental care. -Immunizations are up-to-date, she has received her first Covid vaccine and no shingles vaccines. -Screening labs today. -Healthy lifestyle has been discussed in detail. -She had a colonoscopy in 2017 and is a 5-year callback. -Had a mammogram in January 2021 that is normal. -Follows with GYN, no  longer does Pap smears as she had a total hysterectomy.  GAD (generalized anxiety disorder) -Well-controlled, mood is stable.   Essential hypertension -Well-controlled on current regimen.  Other specified hypothyroidism -Followed by endocrinology, on levothyroxine.    Patient Instructions  -Nice seeing you today!!  -Lab work today; will notify you once results are available.  -Schedule follow up in 6 months.   Preventive Care 75-7 Years Old, Female Preventive care refers to visits with your health care provider and lifestyle choices that can promote health and wellness. This includes:  A yearly physical exam. This may also be called an annual well check.  Regular dental visits and eye exams.  Immunizations.  Screening for certain conditions.  Healthy lifestyle choices, such as eating a healthy diet, getting regular exercise, not using drugs or products that contain nicotine and tobacco, and limiting alcohol use. What can I expect for my preventive care visit? Physical exam Your health care provider will check your:  Height and weight. This may be used to calculate body mass index (BMI), which tells if you are at a healthy weight.  Heart rate and blood pressure.  Skin for abnormal spots. Counseling Your health care provider may ask you questions about your:  Alcohol, tobacco, and drug use.  Emotional well-being.  Home and relationship well-being.  Sexual activity.  Eating habits.  Work and work Statistician.  Method of birth  control.  Menstrual cycle.  Pregnancy history. What immunizations do I need?  Influenza (flu) vaccine  This is recommended every year. Tetanus, diphtheria, and pertussis (Tdap) vaccine  You may need a Td booster every 10 years. Varicella (chickenpox) vaccine  You may need this if you have not been vaccinated. Zoster (shingles) vaccine  You may need this after age 19. Measles, mumps, and rubella (MMR) vaccine  You may  need at least one dose of MMR if you were born in 1957 or later. You may also need a second dose. Pneumococcal conjugate (PCV13) vaccine  You may need this if you have certain conditions and were not previously vaccinated. Pneumococcal polysaccharide (PPSV23) vaccine  You may need one or two doses if you smoke cigarettes or if you have certain conditions. Meningococcal conjugate (MenACWY) vaccine  You may need this if you have certain conditions. Hepatitis A vaccine  You may need this if you have certain conditions or if you travel or work in places where you may be exposed to hepatitis A. Hepatitis B vaccine  You may need this if you have certain conditions or if you travel or work in places where you may be exposed to hepatitis B. Haemophilus influenzae type b (Hib) vaccine  You may need this if you have certain conditions. Human papillomavirus (HPV) vaccine  If recommended by your health care provider, you may need three doses over 6 months. You may receive vaccines as individual doses or as more than one vaccine together in one shot (combination vaccines). Talk with your health care provider about the risks and benefits of combination vaccines. What tests do I need? Blood tests  Lipid and cholesterol levels. These may be checked every 5 years, or more frequently if you are over 35 years old.  Hepatitis C test.  Hepatitis B test. Screening  Lung cancer screening. You may have this screening every year starting at age 74 if you have a 30-pack-year history of smoking and currently smoke or have quit within the past 15 years.  Colorectal cancer screening. All adults should have this screening starting at age 32 and continuing until age 76. Your health care provider may recommend screening at age 80 if you are at increased risk. You will have tests every 1-10 years, depending on your results and the type of screening test.  Diabetes screening. This is done by checking your blood  sugar (glucose) after you have not eaten for a while (fasting). You may have this done every 1-3 years.  Mammogram. This may be done every 1-2 years. Talk with your health care provider about when you should start having regular mammograms. This may depend on whether you have a family history of breast cancer.  BRCA-related cancer screening. This may be done if you have a family history of breast, ovarian, tubal, or peritoneal cancers.  Pelvic exam and Pap test. This may be done every 3 years starting at age 3. Starting at age 58, this may be done every 5 years if you have a Pap test in combination with an HPV test. Other tests  Sexually transmitted disease (STD) testing.  Bone density scan. This is done to screen for osteoporosis. You may have this scan if you are at high risk for osteoporosis. Follow these instructions at home: Eating and drinking  Eat a diet that includes fresh fruits and vegetables, whole grains, lean protein, and low-fat dairy.  Take vitamin and mineral supplements as recommended by your health care provider.  Do  not drink alcohol if: ? Your health care provider tells you not to drink. ? You are pregnant, may be pregnant, or are planning to become pregnant.  If you drink alcohol: ? Limit how much you have to 0-1 drink a day. ? Be aware of how much alcohol is in your drink. In the [place] one drink equals one 12 oz bottle of beer (355 mL), one 5 oz glass of wine (148 mL), or one 1 oz glass of hard liquor (44 mL). Lifestyle  Take daily care of your teeth and gums.  Stay active. Exercise for at least 30 minutes on 5 or more days each week.  Do not use any products that contain nicotine or tobacco, such as cigarettes, e-cigarettes, and chewing tobacco. If you need help quitting, ask your health care provider.  If you are sexually active, practice safe sex. Use a condom or other form of birth control (contraception) in order to prevent pregnancy and STIs (sexually  transmitted infections).  If told by your health care provider, take low-dose aspirin daily starting at age 39. What's next?  Visit your health care provider once a year for a well check visit.  Ask your health care provider how often you should have your eyes and teeth checked.  Stay up to date on all vaccines. This information is not intended to replace advice given to you by your health care provider. Make sure you discuss any questions you have with your health care provider. Document Revised: 06/24/2018 Document Reviewed: 06/24/2018 Elsevier Patient Education  2020 [place], [place] [facility]

## 2020-04-02 ENCOUNTER — Ambulatory Visit: Payer: No Typology Code available for payment source | Attending: Internal Medicine

## 2020-04-02 DIAGNOSIS — Z20822 Contact with and (suspected) exposure to covid-19: Secondary | ICD-10-CM

## 2020-04-03 LAB — SARS-COV-2, NAA 2 DAY TAT

## 2020-04-03 LAB — NOVEL CORONAVIRUS, NAA: SARS-CoV-2, NAA: NOT DETECTED

## 2020-04-16 ENCOUNTER — Encounter: Payer: Self-pay | Admitting: Internal Medicine

## 2020-04-16 DIAGNOSIS — M21619 Bunion of unspecified foot: Secondary | ICD-10-CM

## 2020-04-19 ENCOUNTER — Other Ambulatory Visit: Payer: Self-pay | Admitting: Internal Medicine

## 2020-05-03 ENCOUNTER — Ambulatory Visit: Payer: Self-pay | Admitting: Podiatry

## 2020-05-17 ENCOUNTER — Ambulatory Visit (INDEPENDENT_AMBULATORY_CARE_PROVIDER_SITE_OTHER): Payer: No Typology Code available for payment source

## 2020-05-17 ENCOUNTER — Ambulatory Visit: Payer: No Typology Code available for payment source | Admitting: Podiatry

## 2020-05-17 ENCOUNTER — Other Ambulatory Visit: Payer: Self-pay

## 2020-05-17 ENCOUNTER — Encounter: Payer: Self-pay | Admitting: Podiatry

## 2020-05-17 DIAGNOSIS — M2011 Hallux valgus (acquired), right foot: Secondary | ICD-10-CM

## 2020-05-17 DIAGNOSIS — N951 Menopausal and female climacteric states: Secondary | ICD-10-CM | POA: Insufficient documentation

## 2020-05-17 DIAGNOSIS — N924 Excessive bleeding in the premenopausal period: Secondary | ICD-10-CM | POA: Insufficient documentation

## 2020-05-17 NOTE — Progress Notes (Signed)
Subjective:  Patient ID: Chelsea Frye, female    DOB: 1966/03/19,  MRN: [id number] HPI Chief Complaint  Patient presents with  . Foot Pain    1st MPJ right - bunion deformity x years, started to hurt more, cramping, shoes uncomfortable, redness, massages at night  . New Patient (Initial Visit)    54 y.o. female presents with the above complaint.   ROS: Denies fever chills nausea vomiting muscle aches pains calf pain back pain chest pain shortness of breath.  Past Medical History:  Diagnosis Date  . Anemia    history of  . Chicken pox   . Essential hypertension 08/27/2015  . Frequent headaches    Migraines  . Hot flashes   . Hypothyroidism   . PONV (postoperative nausea and vomiting)   . S/P laparoscopic hysterectomy 06/17/2017  . Thyroid cancer ([place]) 20 years ago   hypothyroidism, followed by endocrine  . UTI (urinary tract infection)    Past Surgical History:  Procedure Laterality Date  . ABDOMINAL HYSTERECTOMY N/A 06/17/2017   Procedure: POSSIBLE HYSTERECTOMY ABDOMINAL;  Surgeon: Janyth Contes, MD;  Location: [facility];  Service: Gynecology;  Laterality: N/A;  . COLONOSCOPY    . HERNIA REPAIR    . LAPAROSCOPIC HYSTERECTOMY N/A 06/17/2017   Procedure: HYSTERECTOMY TOTAL LAPAROSCOPIC;  Surgeon: Janyth Contes, MD;  Location: [facility];  Service: Gynecology;  Laterality: N/A;  MD needs 2.5hrs OR time  . SCALP REDUCTION     less than 1 years old  . THYROIDECTOMY  1995    Current Outpatient Medications:  .  hydrochlorothiazide (HYDRODIURIL) 25 MG tablet, TAKE 1 TABLET DAILY, Disp: 90 tablet, Rfl: 1 .  levothyroxine (SYNTHROID) 25 MCG tablet, Take 1 tablet in AM on empty stomach 3 days a week for thyroid (name brand only DAW) [in addition to the 200 mcg daily], Disp: , Rfl:  .  Multiple Vitamins-Minerals (ONE-A-DAY WOMENS 50 PLUS PO), Take 1 tablet by mouth daily. , Disp: , Rfl:  .  PARoxetine (PAXIL) 20 MG tablet, TAKE 1 TABLET BY MOUTH EVERY DAY, Disp: 90 tablet,  Rfl: 1 .  SYNTHROID 200 MCG tablet, Take 200 mcg by mouth daily before breakfast. , Disp: , Rfl:  .  ZIOPTAN 0.0015 % SOLN, SMARTSIG:1 Drop(s) In Eye(s) Every Evening, Disp: , Rfl:   No Known Allergies Review of Systems Objective:  There were no vitals filed for this visit.  General: Well developed, nourished, in no acute distress, alert and oriented x3   Dermatological: Skin is warm, dry and supple bilateral. Nails x 10 are well maintained; remaining integument appears unremarkable at this time. There are no open sores, no preulcerative lesions, no rash or signs of infection present.  Vascular: Dorsalis Pedis artery and Posterior Tibial artery pedal pulses are 2/4 bilateral with immedate capillary fill time. Pedal hair growth present. No varicosities and no lower extremity edema present bilateral.   Neruologic: Grossly intact via light touch bilateral. Vibratory intact via tuning fork bilateral. Protective threshold with Semmes Wienstein monofilament intact to all pedal sites bilateral. Patellar and Achilles deep tendon reflexes 2+ bilateral. No Babinski or clonus noted bilateral.   Musculoskeletal: No gross boney pedal deformities bilateral. No pain, crepitus, or limitation noted with foot and ankle range of motion bilateral. Muscular strength 5/5 in all groups tested bilateral.  Her subtalar joint and talonavicular joint are easily reducible.  She has no arthritic crepitus.  She has hypermobility at the first metatarsal cuneiform joint.  Which is easily reducible the first metatarsophalangeal  joint appears to be in a valgus position as does the toe.  It is also track bound.  Gait: Unassisted, Nonantalgic.    Radiographs:  Radiographs taken today demonstrate an osseously mature individual with moderate pes planovalgus and pes planus with medial expression of the talonavicular joint.  She also has an increase in the first intermetatarsal angle greater than normal value and an elevated first  metatarsal with an elongated plantarflexed second metatarsal.  Assessment & Plan:   Assessment: Severe flatfoot deformity with pes planovalgus.  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  Plan: Discussed etiology pathology conservative versus surgical therapies at this point we did discuss correction of her right foot deformity with a sliding calcaneal osteotomy and a talonavicular joint fusion or the use of orthotics.  We also discussed fusion of the first metatarsal medial cuneiform joint with reduction in the angular deformity and rotation of the first metatarsal and the first metatarsophalangeal joint.  At this point she does not want aggressive her foot surgery states that she would be happy to wear orthotics in the future and would like to consider surgery to her bunion.  So we consented her today for Lapidus procedure first metatarsophalangeal joint of the right foot.  She understands that she will be utilizing a cast and crutches and/or knee scooter.  She understands this and is amenable to it we did discuss the possible postop complications which may include but not limited to postop pain bleeding swelling infection recurrence need for further surgery overcorrection under correction loss of digit loss of limb loss of life we also discussed failure of internal fixation and nonunions.  She understands this is amenable to it she signed all the pages of the consent form today and will be following up with [place] in January for surgery.  We will provide her with instructions both oral and written for postop recovery as well as anesthesia group and instructions for the morning of surgery.     Ahley Bulls T. [place], [place]

## 2020-05-28 ENCOUNTER — Encounter: Payer: Self-pay | Admitting: Internal Medicine

## 2020-05-30 ENCOUNTER — Other Ambulatory Visit: Payer: Self-pay | Admitting: Internal Medicine

## 2020-07-24 ENCOUNTER — Other Ambulatory Visit: Payer: Self-pay

## 2020-07-24 ENCOUNTER — Ambulatory Visit (INDEPENDENT_AMBULATORY_CARE_PROVIDER_SITE_OTHER): Payer: No Typology Code available for payment source | Admitting: *Deleted

## 2020-07-24 DIAGNOSIS — Z23 Encounter for immunization: Secondary | ICD-10-CM

## 2020-11-05 ENCOUNTER — Telehealth: Payer: Self-pay

## 2020-11-05 NOTE — Telephone Encounter (Signed)
Chelsea Frye called to cancel her surgery with Dr. Milinda Pointer on 11/16/2020. She stated she has a presentation that she has to do for work and will not be able to have surgery at this time. She stated she will call me back to reschedule. I notified Caren Griffins with [facility] and Dr. Milinda Pointer

## 2020-11-15 ENCOUNTER — Encounter: Payer: No Typology Code available for payment source | Admitting: Podiatry

## 2020-11-20 ENCOUNTER — Other Ambulatory Visit: Payer: Self-pay | Admitting: Internal Medicine

## 2020-11-22 ENCOUNTER — Encounter: Payer: No Typology Code available for payment source | Admitting: Podiatry

## 2020-11-26 ENCOUNTER — Other Ambulatory Visit: Payer: Self-pay | Admitting: Internal Medicine

## 2020-11-29 ENCOUNTER — Encounter: Payer: Self-pay | Admitting: Internal Medicine

## 2020-11-29 ENCOUNTER — Encounter: Payer: No Typology Code available for payment source | Admitting: Podiatry

## 2020-12-06 ENCOUNTER — Encounter: Payer: No Typology Code available for payment source | Admitting: Podiatry

## 2020-12-13 ENCOUNTER — Encounter: Payer: No Typology Code available for payment source | Admitting: Podiatry

## 2020-12-20 ENCOUNTER — Encounter: Payer: No Typology Code available for payment source | Admitting: Podiatry

## 2020-12-27 ENCOUNTER — Encounter: Payer: No Typology Code available for payment source | Admitting: Podiatry

## 2021-01-31 ENCOUNTER — Encounter: Payer: Self-pay | Admitting: Internal Medicine

## 2021-01-31 DIAGNOSIS — Z1283 Encounter for screening for malignant neoplasm of skin: Secondary | ICD-10-CM

## 2021-05-12 ENCOUNTER — Other Ambulatory Visit: Payer: Self-pay | Admitting: Internal Medicine

## 2021-05-18 ENCOUNTER — Other Ambulatory Visit: Payer: Self-pay | Admitting: Internal Medicine

## 2021-06-09 ENCOUNTER — Encounter: Payer: Self-pay | Admitting: Internal Medicine

## 2021-06-11 MED ORDER — HYDROCHLOROTHIAZIDE 25 MG PO TABS: 25.0000 mg | ORAL_TABLET | Freq: Every day | ORAL | 0 refills | Status: DC

## 2021-06-16 ENCOUNTER — Encounter: Payer: Self-pay | Admitting: Internal Medicine

## 2021-07-03 ENCOUNTER — Encounter: Payer: No Typology Code available for payment source | Admitting: Internal Medicine

## 2021-07-16 ENCOUNTER — Ambulatory Visit: Payer: No Typology Code available for payment source | Admitting: Physician Assistant

## 2021-07-31 ENCOUNTER — Other Ambulatory Visit: Payer: Self-pay

## 2021-08-01 ENCOUNTER — Ambulatory Visit (INDEPENDENT_AMBULATORY_CARE_PROVIDER_SITE_OTHER): Payer: No Typology Code available for payment source | Admitting: Internal Medicine

## 2021-08-01 ENCOUNTER — Other Ambulatory Visit: Payer: Self-pay | Admitting: Internal Medicine

## 2021-08-01 ENCOUNTER — Encounter: Payer: Self-pay | Admitting: Internal Medicine

## 2021-08-01 VITALS — BP 110/80 | HR 76 | Temp 97.6°F | Ht 67.0 in | Wt 165.6 lb

## 2021-08-01 DIAGNOSIS — Z0000 Encounter for general adult medical examination without abnormal findings: Secondary | ICD-10-CM | POA: Diagnosis not present

## 2021-08-01 DIAGNOSIS — E038 Other specified hypothyroidism: Secondary | ICD-10-CM

## 2021-08-01 DIAGNOSIS — R232 Flushing: Secondary | ICD-10-CM | POA: Diagnosis not present

## 2021-08-01 DIAGNOSIS — E782 Mixed hyperlipidemia: Secondary | ICD-10-CM

## 2021-08-01 DIAGNOSIS — I10 Essential (primary) hypertension: Secondary | ICD-10-CM

## 2021-08-01 DIAGNOSIS — E785 Hyperlipidemia, unspecified: Secondary | ICD-10-CM | POA: Insufficient documentation

## 2021-08-01 LAB — CBC WITH DIFFERENTIAL/PLATELET
Basophils Absolute: 0 10*3/uL (ref 0.0–0.1)
Basophils Relative: 0.6 % (ref 0.0–3.0)
Eosinophils Absolute: 0.1 10*3/uL (ref 0.0–0.7)
Eosinophils Relative: 1.6 % (ref 0.0–5.0)
HCT: 39.7 % (ref 36.0–46.0)
Hemoglobin: 13.7 g/dL (ref 12.0–15.0)
Lymphocytes Relative: 31.6 % (ref 12.0–46.0)
Lymphs Abs: 1 10*3/uL (ref 0.7–4.0)
MCHC: 34.6 g/dL (ref 30.0–36.0)
MCV: 85.7 fl (ref 78.0–100.0)
Monocytes Absolute: 0.3 10*3/uL (ref 0.1–1.0)
Monocytes Relative: 8.1 % (ref 3.0–12.0)
Neutro Abs: 1.9 10*3/uL (ref 1.4–7.7)
Neutrophils Relative %: 58.1 % (ref 43.0–77.0)
Platelets: 291 10*3/uL (ref 150.0–400.0)
RBC: 4.63 Mil/uL (ref 3.87–5.11)
RDW: 13.3 % (ref 11.5–15.5)
WBC: 3.3 10*3/uL — ABNORMAL LOW (ref 4.0–10.5)

## 2021-08-01 LAB — COMPREHENSIVE METABOLIC PANEL
ALT: 26 U/L (ref 0–35)
AST: 21 U/L (ref 0–37)
Albumin: 4.6 g/dL (ref 3.5–5.2)
Alkaline Phosphatase: 63 U/L (ref 39–117)
BUN: 15 mg/dL (ref 6–23)
CO2: 31 mEq/L (ref 19–32)
Calcium: 9.8 mg/dL (ref 8.4–10.5)
Chloride: 101 mEq/L (ref 96–112)
Creatinine, Ser: 0.91 mg/dL (ref 0.40–1.20)
GFR: 70.62 mL/min (ref >60.00)
Glucose, Bld: 98 mg/dL (ref 70–99)
Potassium: 3.7 mEq/L (ref 3.5–5.1)
Sodium: 141 mEq/L (ref 135–145)
Total Bilirubin: 0.5 mg/dL (ref 0.2–1.2)
Total Protein: 7.4 g/dL (ref 6.0–8.3)

## 2021-08-01 LAB — LIPID PANEL
Cholesterol: 248 mg/dL — ABNORMAL HIGH (ref 0–200)
HDL: 65.6 mg/dL (ref >39.00)
LDL Cholesterol: 144 mg/dL — ABNORMAL HIGH (ref 0–99)
NonHDL: 181.94
Total CHOL/HDL Ratio: 4
Triglycerides: 189 mg/dL — ABNORMAL HIGH (ref 0.0–149.0)
VLDL: 37.8 mg/dL (ref 0.0–40.0)

## 2021-08-01 LAB — VITAMIN D 25 HYDROXY (VIT D DEFICIENCY, FRACTURES): VITD: 67.5 ng/mL (ref 30.00–100.00)

## 2021-08-01 LAB — VITAMIN B12: Vitamin B-12: 360 pg/mL (ref 211–911)

## 2021-08-01 LAB — HEMOGLOBIN A1C: Hgb A1c MFr Bld: 5.8 % (ref 4.6–6.5)

## 2021-08-01 MED ORDER — ATORVASTATIN CALCIUM 10 MG PO TABS: 10.0000 mg | ORAL_TABLET | Freq: Every day | ORAL | 1 refills | Status: DC

## 2021-08-01 MED ORDER — FISH OIL 1000 MG PO CAPS: 1.0000 | ORAL_CAPSULE | Freq: Two times a day (BID) | ORAL | 0 refills | Status: AC

## 2021-08-01 NOTE — Patient Instructions (Signed)
-  Nice seeing you today!!  -Lab work today; will notify you once results are available.  -See you back in 1 year or sooner as needed.

## 2021-08-01 NOTE — Progress Notes (Signed)
Established Patient Office Visit     This visit occurred during the SARS-CoV-2 public health emergency.  Safety protocols were in place, including screening questions prior to the visit, additional usage of staff PPE, and extensive cleaning of exam room while observing appropriate contact time as indicated for disinfecting solutions.    CC/Reason for Visit: Annual preventive exam  HPI: Chelsea Frye is a 55 y.o. female who is coming in today for the above mentioned reasons. Past Medical History is significant for: Hypertension that has been well controlled on hydrochlorothiazide, hot flashes for which she takes paroxetine.  She has a history of iatrogenic hypothyroidism following total thyroid resection for thyroid cancer.  She is followed by endocrinology, she tells me that her recent thyroid work-up was normal.  She has routine eye and dental care.  She exercises routinely.  All immunizations are up-to-date.  She already has a referral for colonoscopy which she will have in January.  She had a normal mammogram in January of this year as well as a Pap smear, all of this is done through GYN and I do not ports currently.  No acute concerns or complaints today.   Past Medical/Surgical History: Past Medical History:  Diagnosis Date   Anemia    history of   Chicken pox    Essential hypertension 08/27/2015   Frequent headaches    Migraines   Hot flashes    Hypothyroidism    PONV (postoperative nausea and vomiting)    S/P laparoscopic hysterectomy 06/17/2017   Thyroid cancer ([place]) 20 years ago   hypothyroidism, followed by endocrine   UTI (urinary tract infection)     Past Surgical History:  Procedure Laterality Date   ABDOMINAL HYSTERECTOMY N/A 06/17/2017   Procedure: POSSIBLE HYSTERECTOMY ABDOMINAL;  Surgeon: Janyth Contes, MD;  Location: [facility];  Service: Gynecology;  Laterality: N/A;   COLONOSCOPY     HERNIA REPAIR     LAPAROSCOPIC HYSTERECTOMY N/A 06/17/2017    Procedure: HYSTERECTOMY TOTAL LAPAROSCOPIC;  Surgeon: Janyth Contes, MD;  Location: [facility];  Service: Gynecology;  Laterality: N/A;  MD needs 2.5hrs OR time   SCALP REDUCTION     less than 49 years old   THYROIDECTOMY  19    Social History:  reports that she has never smoked. She has never used smokeless tobacco. She reports current alcohol use. She reports that she does not use drugs.  Allergies: No Known Allergies  Family History:  Family History  Problem Relation Age of Onset   Hyperlipidemia Father    Hypertension Mother    Hyperlipidemia Brother    Hypertension Brother    Diabetes Maternal Grandmother    Diabetes Maternal Aunt    Colon cancer Neg Hx      Current Outpatient Medications:    hydrochlorothiazide (HYDRODIURIL) 25 MG tablet, Take 1 tablet (25 mg total) by mouth daily., Disp: 90 tablet, Rfl: 0   levothyroxine (SYNTHROID) 25 MCG tablet, Take 1 tablet in AM on empty stomach 3 days a week for thyroid (name brand only DAW) [in addition to the 200 mcg daily], Disp: , Rfl:    Multiple Vitamins-Minerals (ONE-A-DAY WOMENS 50 PLUS PO), Take 1 tablet by mouth daily. , Disp: , Rfl:    PARoxetine (PAXIL) 20 MG tablet, TAKE 1 TABLET BY MOUTH EVERY DAY, Disp: 90 tablet, Rfl: 1   SYNTHROID 200 MCG tablet, Take 200 mcg by mouth daily before breakfast. , Disp: , Rfl:    ZIOPTAN 0.0015 % SOLN, SMARTSIG:1  Drop(s) In [employer]) Every Evening, Disp: , Rfl:   Review of Systems:  Constitutional: Denies fever, chills, diaphoresis, appetite change and fatigue.  HEENT: Denies photophobia, eye pain, redness, hearing loss, ear pain, congestion, sore throat, rhinorrhea, sneezing, mouth sores, trouble swallowing, neck pain, neck stiffness and tinnitus.   Respiratory: Denies SOB, DOE, cough, chest tightness,  and wheezing.   Cardiovascular: Denies chest pain, palpitations and leg swelling.  Gastrointestinal: Denies nausea, vomiting, abdominal pain, diarrhea, constipation, blood in stool  and abdominal distention.  Genitourinary: Denies dysuria, urgency, frequency, hematuria, flank pain and difficulty urinating.  Endocrine: Denies: hot or cold intolerance, sweats, changes in hair or nails, polyuria, polydipsia. Musculoskeletal: Denies myalgias, back pain, joint swelling, arthralgias and gait problem.  Skin: Denies pallor, rash and wound.  Neurological: Denies dizziness, seizures, syncope, weakness, light-headedness, numbness and headaches.  Hematological: Denies adenopathy. Easy bruising, personal or family bleeding history  Psychiatric/Behavioral: Denies suicidal ideation, mood changes, confusion, nervousness, sleep disturbance and agitation    Physical Exam: Vitals:   08/01/21 0906  BP: 110/80  Pulse: 76  Temp: 97.6 F (36.4 C)  TempSrc: Oral  Weight: 165 lb 9.6 oz (75.1 kg)  Height: 5\' 7"  (1.702 m)    Body mass index is 25.94 kg/m.   Constitutional: NAD, calm, comfortable Eyes: PERRL, lids and conjunctivae normal ENMT: Mucous membranes are moist. Posterior pharynx clear of any exudate or lesions. Normal dentition. Tympanic membrane is pearly white, no erythema or bulging. Neck: normal, supple, no masses, no thyromegaly Respiratory: clear to auscultation bilaterally, no wheezing, no crackles. Normal respiratory effort. No accessory muscle use.  Cardiovascular: Regular rate and rhythm, no murmurs / rubs / gallops. No extremity edema. 2+ pedal pulses. No carotid bruits.  Abdomen: no tenderness, no masses palpated. No hepatosplenomegaly. Bowel sounds positive.  Musculoskeletal: no clubbing / cyanosis. No joint deformity upper and lower extremities. Good ROM, no contractures. Normal muscle tone.  Skin: no rashes, lesions, ulcers. No induration Neurologic: CN 2-12 grossly intact. Sensation intact, DTR normal. Strength 5/5 in all 4.  Psychiatric: Normal judgment and insight. Alert and oriented x 3. Normal mood.    Impression and Plan:  Encounter for preventive  health examination -She has routine eye and dental care. -All immunizations are up-to-date. -Labs to be updated today. -Healthy lifestyle discussed in detail. -She will have her colonoscopy in January. -Mammogram and Pap smear were done in January 2022 by GYN.  Essential hypertension  - Plan: CBC with Differential/Platelet, Comprehensive metabolic panel, Hemoglobin A1c, Lipid panel, Vitamin B12, VITAMIN D 25 Hydroxy (Vit-D Deficiency, Fractures) -Blood pressures well controlled on hydrochlorothiazide.  Other specified hypothyroidism -On levothyroxine, followed by endocrinology.  Hot flashes -Controlled on paroxetine.    Patient Instructions  -Nice seeing you today!!  -Lab work today; will notify you once results are available.  -See you back in 1 year or sooner as needed.     Lelon Frohlich, MD [facility] at [facility]

## 2021-08-01 NOTE — Addendum Note (Signed)
Addended by: Amanda Cockayne on: 08/01/2021 09:33 AM   Modules accepted: Orders

## 2021-08-06 ENCOUNTER — Other Ambulatory Visit: Payer: Self-pay

## 2021-08-06 ENCOUNTER — Ambulatory Visit: Payer: No Typology Code available for payment source | Admitting: Physician Assistant

## 2021-08-06 ENCOUNTER — Encounter: Payer: Self-pay | Admitting: Physician Assistant

## 2021-08-06 DIAGNOSIS — Z1283 Encounter for screening for malignant neoplasm of skin: Secondary | ICD-10-CM

## 2021-08-06 DIAGNOSIS — L821 Other seborrheic keratosis: Secondary | ICD-10-CM | POA: Diagnosis not present

## 2021-08-06 NOTE — Progress Notes (Signed)
   New Patient   Subjective  Chelsea Frye is a 55 y.o. female who presents for the following: New Patient (Initial Visit) (Patient here today for skin check this is patient's first Dermatology appointment. No personal history or family history of atypical moles, melanoma or non mole skin cancer. ).    The following portions of the chart were reviewed this encounter and updated as appropriate:  Tobacco  Allergies  Meds  Problems  Med Hx  Surg Hx  Fam Hx      Objective  Well appearing patient in no apparent distress; mood and affect are within normal limits.  A full examination was performed including scalp, head, eyes, ears, nose, lips, neck, chest, axillae, abdomen, back, buttocks, bilateral upper extremities, bilateral lower extremities, hands, feet, fingers, toes, fingernails, and toenails. All findings within normal limits unless otherwise noted below.  head to toe No atypical nevi or signs of NMSC noted at the time of the visit.   Right Breast Stuck-on smooth brown plaque--Discussed benign etiology and prognosis.   Assessment & Plan  Skin exam for malignant neoplasm head to toe  Yearly skin check  Seborrheic keratosis Right Breast  Observe. Return to clinic if recurs.    I, Nelma Phagan, PA-C, have reviewed all documentation's for this visit.  The documentation on 08/06/21 for the exam, diagnosis, procedures and orders are all accurate and complete.

## 2021-08-06 NOTE — Patient Instructions (Signed)
Seborrheic Keratosis A seborrheic keratosis is a common, noncancerous (benign) skin growth. These growths are velvety, waxy, rough, tan, brown, or black spots that appear on the skin. These skin growths can be flat or raised, andscaly. What are the causes? The cause of this condition is not known. What increases the risk? You are more likely to develop this condition if you: Have a family history of seborrheic keratosis. Are 50 or older. Are pregnant. Have had estrogen replacement therapy. What are the signs or symptoms? Symptoms of this condition include growths on the face, chest, shoulders, back, or other areas. These growths: Are usually painless, but may become irritated and itchy. Can be yellow, brown, black, or other colors. Are slightly raised or have a flat surface. Are sometimes rough or wart-like in texture. Are often velvety or waxy on the surface. Are round or oval-shaped. Often occur in groups, but may occur as a single growth. How is this diagnosed? This condition is diagnosed with a medical history and physical exam. A sample of the growth may be tested (skin biopsy). You may need to see a skin specialist (dermatologist). How is this treated? Treatment is not usually needed for this condition, unless the growths are irritated or bleed often. You may also choose to have the growths removed if you do not like their appearance. Most commonly, these growths are treated with a procedure in which liquid nitrogen is applied to "freeze" off the growth (cryosurgery). They may also be burned off with electricity (electrocautery) or removed by scraping (curettage). Follow these instructions at home: Watch your growth for any changes. Keep all follow-up visits as told by your health care provider. This is important. Do not scratch or pick at the growth or growths. This can cause them to become irritated or infected. Contact a health care provider if: You suddenly have many new  growths. Your growth bleeds, itches, or hurts. Your growth suddenly becomes larger or changes color. Summary A seborrheic keratosis is a common, noncancerous (benign) skin growth. Treatment is not usually needed for this condition, unless the growths are irritated or bleed often. Watch your growth for any changes. Contact a health care provider if you suddenly have many new growths or your growth suddenly becomes larger or changes color. Keep all follow-up visits as told by your health care provider. This is important. This information is not intended to replace advice given to you by your health care provider. Make sure you discuss any questions you have with your healthcare provider. Document Revised: 02/25/2018 Document Reviewed: 02/25/2018 Elsevier Patient Education  2022 Elsevier Inc.  

## 2021-08-29 ENCOUNTER — Telehealth: Payer: Self-pay | Admitting: Internal Medicine

## 2021-08-29 NOTE — Telephone Encounter (Signed)
Inbound call from patient to schedule her colonoscopy. States in 2017 when she attempted to have colonoscopy, her colon was twisted which resulted in a virtual colon. She would like a call back to know which colonoscopy is needed before scheduling

## 2021-08-29 NOTE — Telephone Encounter (Signed)
Per virtual colon report:   Notes Recorded by Jerene Bears, MD on 02/09/2016 at 9:46 PM Virtual colon neg for polyps/masses in the colon I recommend attempt at repeat full colonoscopy in 5 yrs, please place recall Thanks  Please call pt and schedule colonoscopy.

## 2021-09-02 ENCOUNTER — Encounter: Payer: Self-pay | Admitting: Internal Medicine

## 2021-09-03 ENCOUNTER — Encounter: Payer: Self-pay | Admitting: Internal Medicine

## 2021-09-16 ENCOUNTER — Other Ambulatory Visit: Payer: Self-pay | Admitting: Internal Medicine

## 2021-09-17 MED ORDER — HYDROCHLOROTHIAZIDE 25 MG PO TABS: 25.0000 mg | ORAL_TABLET | Freq: Every day | ORAL | 1 refills | Status: DC

## 2021-10-17 ENCOUNTER — Other Ambulatory Visit: Payer: Self-pay | Admitting: Internal Medicine

## 2021-10-25 ENCOUNTER — Encounter: Payer: Self-pay | Admitting: Internal Medicine

## 2021-10-25 DIAGNOSIS — E782 Mixed hyperlipidemia: Secondary | ICD-10-CM

## 2021-10-29 MED ORDER — ATORVASTATIN CALCIUM 10 MG PO TABS: 10.0000 mg | ORAL_TABLET | Freq: Every day | ORAL | 2 refills | Status: DC

## 2021-10-31 ENCOUNTER — Ambulatory Visit: Payer: No Typology Code available for payment source | Admitting: Internal Medicine

## 2021-10-31 ENCOUNTER — Ambulatory Visit (AMBULATORY_SURGERY_CENTER): Payer: No Typology Code available for payment source

## 2021-10-31 ENCOUNTER — Telehealth: Payer: Self-pay

## 2021-10-31 ENCOUNTER — Other Ambulatory Visit: Payer: Self-pay

## 2021-10-31 VITALS — Ht 67.0 in | Wt 150.0 lb

## 2021-10-31 DIAGNOSIS — Z86010 Personal history of colonic polyps: Secondary | ICD-10-CM

## 2021-10-31 MED ORDER — PLENVU 140 G PO SOLR: 1.0000 | ORAL | 0 refills | Status: DC

## 2021-10-31 NOTE — Telephone Encounter (Signed)
Dr. Hilarie Fredrickson, During the Pre Visit appt today - the patient has reported that she know her colonoscopy was difficult last time and she was required to complete a virtual colonoscopy after having the colon at the [facility]; Patient is requesting to know if she needs to proceed with scheduled colon on 11/18/2021 or if she needs to be rescheduled for a virtual colon, as it was documented the patient had a tortuous colon, in order to skip the same process that was requested of her last time?  Please advise    Pre Visit completed in case patient is allowed to follow through with scheduled colon at [facility]

## 2021-10-31 NOTE — Telephone Encounter (Signed)
She had a small adenoma at last colonoscopy which is a low risk though precancerous polyp which was removed. Her exam was incomplete and we did proceed with virtual colonoscopy Given this history it is reasonable to repeat virtual colonoscopy now She should be made aware that there is if there are any abnormalities that colonoscopy may be recommended thereafter  If she wishes to discuss more I am happy discussed with her by office visit Thanks JMP

## 2021-10-31 NOTE — Progress Notes (Signed)
Pre visit completed via phone call; Patient verified name, DOB, and address; No egg or soy allergy known to patient  No issues known to pt with past sedation with any surgeries or procedures Patient denies ever being told they had issues or difficulty with intubation  No FH of Malignant Hyperthermia Pt is not on diet pills Pt is not on home 02  Pt is not on blood thinners  Pt denies issues with constipation at this time; No A fib or A flutter Pt is fully vaccinated for Covid x 2 +boosters; Coupon given to pt in PV today, Code to Pharmacy and NO PA's for preps discussed with pt in PV today  Discussed with pt there will be an out-of-pocket cost for prep and that varies from $0 to 70 + dollars - pt verbalized understanding  Due to the COVID-19 pandemic we are asking patients to follow certain guidelines in PV and the [facility]   Pt aware of COVID protocols and [facility] guidelines

## 2021-11-01 NOTE — Telephone Encounter (Signed)
Called and spoke with patient-patient given MD recommendations- patient has requested to "think about it over the weekend and contact my insurance company to see what all is covered with insurance"; Patient verbalized understanding of information/instructions;    Patient advised to call back to the office at [phone] should questions/concerns arise;  patient reports she will call back to the office if she "changes my mind about the procedure"

## 2021-11-04 ENCOUNTER — Ambulatory Visit: Payer: No Typology Code available for payment source | Admitting: Internal Medicine

## 2021-11-04 ENCOUNTER — Other Ambulatory Visit (INDEPENDENT_AMBULATORY_CARE_PROVIDER_SITE_OTHER): Payer: No Typology Code available for payment source

## 2021-11-04 DIAGNOSIS — E782 Mixed hyperlipidemia: Secondary | ICD-10-CM | POA: Diagnosis not present

## 2021-11-04 LAB — LIPID PANEL
Cholesterol: 198 mg/dL (ref 0–200)
HDL: 64.6 mg/dL (ref >39.00)
LDL Cholesterol: 94 mg/dL (ref 0–99)
NonHDL: 133.54
Total CHOL/HDL Ratio: 3
Triglycerides: 196 mg/dL — ABNORMAL HIGH (ref 0.0–149.0)
VLDL: 39.2 mg/dL (ref 0.0–40.0)

## 2021-11-11 ENCOUNTER — Encounter: Payer: Self-pay | Admitting: Internal Medicine

## 2021-11-14 ENCOUNTER — Encounter: Payer: No Typology Code available for payment source | Admitting: Internal Medicine

## 2021-11-15 ENCOUNTER — Encounter: Payer: Self-pay | Admitting: Internal Medicine

## 2021-11-18 ENCOUNTER — Encounter: Payer: No Typology Code available for payment source | Admitting: Internal Medicine

## 2021-12-12 ENCOUNTER — Ambulatory Visit: Payer: No Typology Code available for payment source | Admitting: Internal Medicine

## 2022-02-09 ENCOUNTER — Other Ambulatory Visit: Payer: Self-pay | Admitting: Internal Medicine

## 2022-02-12 ENCOUNTER — Encounter: Payer: Self-pay | Admitting: Internal Medicine

## 2022-05-26 ENCOUNTER — Other Ambulatory Visit: Payer: Self-pay | Admitting: Internal Medicine

## 2022-06-18 ENCOUNTER — Encounter: Payer: Self-pay | Admitting: Internal Medicine

## 2022-06-18 DIAGNOSIS — C439 Malignant melanoma of skin, unspecified: Secondary | ICD-10-CM

## 2022-06-18 NOTE — Telephone Encounter (Signed)
Okay to refer? 

## 2022-08-07 ENCOUNTER — Ambulatory Visit: Payer: No Typology Code available for payment source | Admitting: Physician Assistant

## 2022-08-21 ENCOUNTER — Other Ambulatory Visit: Payer: Self-pay | Admitting: Internal Medicine

## 2022-08-30 ENCOUNTER — Other Ambulatory Visit: Payer: Self-pay | Admitting: Internal Medicine

## 2022-09-15 ENCOUNTER — Other Ambulatory Visit: Payer: Self-pay | Admitting: Internal Medicine

## 2022-10-13 ENCOUNTER — Other Ambulatory Visit: Payer: Self-pay | Admitting: Internal Medicine

## 2022-10-13 DIAGNOSIS — E782 Mixed hyperlipidemia: Secondary | ICD-10-CM

## 2022-11-06 ENCOUNTER — Other Ambulatory Visit: Payer: Self-pay | Admitting: Internal Medicine

## 2022-11-10 ENCOUNTER — Other Ambulatory Visit: Payer: Self-pay | Admitting: Internal Medicine

## 2022-11-18 ENCOUNTER — Other Ambulatory Visit: Payer: Self-pay | Admitting: Internal Medicine

## 2022-11-18 ENCOUNTER — Encounter: Payer: Self-pay | Admitting: Internal Medicine

## 2022-11-18 MED ORDER — HYDROCHLOROTHIAZIDE 25 MG PO TABS: 25.0000 mg | ORAL_TABLET | Freq: Every day | ORAL | 0 refills | Status: DC

## 2022-11-18 MED ORDER — PAROXETINE HCL 20 MG PO TABS: 20.0000 mg | ORAL_TABLET | Freq: Every day | ORAL | 0 refills | Status: DC

## 2022-12-01 ENCOUNTER — Other Ambulatory Visit: Payer: Self-pay | Admitting: *Deleted

## 2022-12-01 MED ORDER — PAROXETINE HCL 20 MG PO TABS: 20.0000 mg | ORAL_TABLET | Freq: Every day | ORAL | 1 refills | Status: DC

## 2022-12-01 NOTE — Addendum Note (Signed)
Addended by: Westley Hummer B on: 12/01/2022 11:38 AM   Modules accepted: Orders

## 2022-12-02 ENCOUNTER — Ambulatory Visit: Payer: No Typology Code available for payment source | Admitting: Internal Medicine

## 2022-12-03 ENCOUNTER — Other Ambulatory Visit: Payer: Self-pay | Admitting: Internal Medicine

## 2022-12-17 ENCOUNTER — Other Ambulatory Visit: Payer: Self-pay | Admitting: Internal Medicine

## 2023-01-14 ENCOUNTER — Ambulatory Visit (INDEPENDENT_AMBULATORY_CARE_PROVIDER_SITE_OTHER): Payer: No Typology Code available for payment source | Admitting: Internal Medicine

## 2023-01-14 ENCOUNTER — Other Ambulatory Visit: Payer: Self-pay | Admitting: Internal Medicine

## 2023-01-14 ENCOUNTER — Encounter: Payer: Self-pay | Admitting: Internal Medicine

## 2023-01-14 VITALS — BP 120/80 | HR 82 | Temp 97.9°F | Ht 66.25 in | Wt 168.9 lb

## 2023-01-14 DIAGNOSIS — I10 Essential (primary) hypertension: Secondary | ICD-10-CM | POA: Diagnosis not present

## 2023-01-14 DIAGNOSIS — Z1211 Encounter for screening for malignant neoplasm of colon: Secondary | ICD-10-CM

## 2023-01-14 DIAGNOSIS — E038 Other specified hypothyroidism: Secondary | ICD-10-CM

## 2023-01-14 DIAGNOSIS — E785 Hyperlipidemia, unspecified: Secondary | ICD-10-CM

## 2023-01-14 DIAGNOSIS — Z0000 Encounter for general adult medical examination without abnormal findings: Secondary | ICD-10-CM | POA: Diagnosis not present

## 2023-01-14 LAB — COMPREHENSIVE METABOLIC PANEL
ALT: 28 U/L (ref 0–35)
AST: 28 U/L (ref 0–37)
Albumin: 4.5 g/dL (ref 3.5–5.2)
Alkaline Phosphatase: 61 U/L (ref 39–117)
BUN: 15 mg/dL (ref 6–23)
CO2: 28 mEq/L (ref 19–32)
Calcium: 9.9 mg/dL (ref 8.4–10.5)
Chloride: 102 mEq/L (ref 96–112)
Creatinine, Ser: 0.84 mg/dL (ref 0.40–1.20)
GFR: 76.95 mL/min (ref >60.00)
Glucose, Bld: 93 mg/dL (ref 70–99)
Potassium: 3.6 mEq/L (ref 3.5–5.1)
Sodium: 140 mEq/L (ref 135–145)
Total Bilirubin: 0.6 mg/dL (ref 0.2–1.2)
Total Protein: 7.4 g/dL (ref 6.0–8.3)

## 2023-01-14 LAB — CBC WITH DIFFERENTIAL/PLATELET
Basophils Absolute: 0 10*3/uL (ref 0.0–0.1)
Basophils Relative: 0.6 % (ref 0.0–3.0)
Eosinophils Absolute: 0.1 10*3/uL (ref 0.0–0.7)
Eosinophils Relative: 1.8 % (ref 0.0–5.0)
HCT: 40.1 % (ref 36.0–46.0)
Hemoglobin: 13.7 g/dL (ref 12.0–15.0)
Lymphocytes Relative: 33.3 % (ref 12.0–46.0)
Lymphs Abs: 1.2 10*3/uL (ref 0.7–4.0)
MCHC: 34 g/dL (ref 30.0–36.0)
MCV: 84.9 fl (ref 78.0–100.0)
Monocytes Absolute: 0.3 10*3/uL (ref 0.1–1.0)
Monocytes Relative: 7.7 % (ref 3.0–12.0)
Neutro Abs: 2.1 10*3/uL (ref 1.4–7.7)
Neutrophils Relative %: 56.6 % (ref 43.0–77.0)
Platelets: 298 10*3/uL (ref 150.0–400.0)
RBC: 4.73 Mil/uL (ref 3.87–5.11)
RDW: 13.4 % (ref 11.5–15.5)
WBC: 3.7 10*3/uL — ABNORMAL LOW (ref 4.0–10.5)

## 2023-01-14 LAB — LIPID PANEL
Cholesterol: 181 mg/dL (ref 0–200)
HDL: 68.1 mg/dL (ref >39.00)
NonHDL: 112.63
Total CHOL/HDL Ratio: 3
Triglycerides: 239 mg/dL — ABNORMAL HIGH (ref 0.0–149.0)
VLDL: 47.8 mg/dL — ABNORMAL HIGH (ref 0.0–40.0)

## 2023-01-14 LAB — VITAMIN B12: Vitamin B-12: 333 pg/mL (ref 211–911)

## 2023-01-14 LAB — LDL CHOLESTEROL, DIRECT: Direct LDL: 84 mg/dL

## 2023-01-14 LAB — TSH: TSH: 0.01 u[IU]/mL — ABNORMAL LOW (ref 0.35–5.50)

## 2023-01-14 LAB — VITAMIN D 25 HYDROXY (VIT D DEFICIENCY, FRACTURES): VITD: 43.02 ng/mL (ref 30.00–100.00)

## 2023-01-14 MED ORDER — HYDROCHLOROTHIAZIDE 25 MG PO TABS: 25.0000 mg | ORAL_TABLET | Freq: Every day | ORAL | 1 refills | Status: DC

## 2023-01-14 NOTE — Progress Notes (Signed)
Established Patient Office Visit     CC/Reason for Visit: Annual preventive exam  HPI: Chelsea Frye is a 57 y.o. female who is coming in today for the above mentioned reasons. Past Medical History is significant for: Hypertension, hypothyroidism followed by endocrinology and perimenopausal vasomotor symptoms on paroxetine.  She is feeling well and has no acute concerns or complaints.  Has routine eye and dental care.  Is started doing yoga.  She will be due for Tdap at the end of the year, all other immunizations are up-to-date.  She has mammogram through her GYN's office.  She is now due for a 5-year colonoscopy.   Past Medical/Surgical History: Past Medical History:  Diagnosis Date   Anemia    history of   Cataract 2021   bilateral sx   Chicken pox    Essential hypertension 08/27/2015   on meds   Frequent headaches    Migraines   Hot flashes    Hyperlipidemia    on meds   Hypothyroidism    on meds   PONV (postoperative nausea and vomiting)    S/P laparoscopic hysterectomy 06/17/2017   Thyroid cancer ([place]) 1994   hypothyroidism, followed by endocrine   UTI (urinary tract infection)     Past Surgical History:  Procedure Laterality Date   ABDOMINAL HYSTERECTOMY N/A 06/17/2017   Procedure: POSSIBLE HYSTERECTOMY ABDOMINAL;  Surgeon: Janyth Contes, MD;  Location: [facility];  Service: Gynecology;  Laterality: N/A;   COLONOSCOPY  2017   JMP-MAC-suprep(good)-TA- tortuous colon-pt then had virtual colon   INGUINAL HERNIA REPAIR Left 2016   LAPAROSCOPIC HYSTERECTOMY N/A 06/17/2017   Procedure: HYSTERECTOMY TOTAL LAPAROSCOPIC;  Surgeon: Janyth Contes, MD;  Location: [facility];  Service: Gynecology;  Laterality: N/A;  MD needs 2.5hrs OR time   SCALP REDUCTION     less than 41 years old   THYROIDECTOMY  [id number]   UMBILICAL HERNIA REPAIR  2006    Social History:  reports that she has never smoked. She has never used smokeless tobacco. She reports current alcohol use  of about 1.0 - 2.0 standard drink of alcohol per week. She reports that she does not use drugs.  Allergies: No Known Allergies  Family History:  Family History  Problem Relation Age of Onset   Hypertension Mother    Hyperlipidemia Father    Hyperlipidemia Brother    Hypertension Brother    Diabetes Maternal Aunt    Diabetes Maternal Grandmother    Colon cancer Neg Hx    Colon polyps Neg Hx    Esophageal cancer Neg Hx    Rectal cancer Neg Hx    Stomach cancer Neg Hx      Current Outpatient Medications:    atorvastatin (LIPITOR) 10 MG tablet, TAKE 1 TABLET BY MOUTH EVERY DAY, Disp: 90 tablet, Rfl: 1   Multiple Vitamins-Minerals (ONE-A-DAY WOMENS 50 PLUS PO), Take 1 tablet by mouth daily. , Disp: , Rfl:    Omega-3 Fatty Acids (FISH OIL) 1000 MG CAPS, Take 1 capsule (1,000 mg total) by mouth 2 (two) times daily., Disp: , Rfl: 0   PARoxetine (PAXIL) 20 MG tablet, Take 1 tablet (20 mg total) by mouth daily. SCHEDULE APPT FOR FUTURE REFILLS, Disp: 30 tablet, Rfl: 1   SYNTHROID 200 MCG tablet, Take 200 mcg by mouth daily before breakfast. , Disp: , Rfl:    ZIOPTAN 0.0015 % SOLN, SMARTSIG:1 Drop(s) In Eye(s) Every Evening, Disp: , Rfl:    hydrochlorothiazide (HYDRODIURIL) 25 MG tablet, Take  1 tablet (25 mg total) by mouth daily., Disp: 90 tablet, Rfl: 1  Review of Systems:  Negative unless indicated in HPI.   Physical Exam: Vitals:   01/14/23 0858  BP: 120/80  Pulse: 82  Temp: 97.9 F (36.6 C)  TempSrc: Oral  SpO2: 97%  Weight: 168 lb 14.4 oz (76.6 kg)  Height: 5' 6.25" (1.683 m)    Body mass index is 27.06 kg/m.   Physical Exam Vitals reviewed.  Constitutional:      General: She is not in acute distress.    Appearance: Normal appearance. She is not ill-appearing, toxic-appearing or diaphoretic.  HENT:     Head: Normocephalic.     Right Ear: Tympanic membrane, ear canal and external ear normal. There is no impacted cerumen.     Left Ear: Tympanic membrane, ear canal  and external ear normal. There is no impacted cerumen.     Nose: Nose normal.     Mouth/Throat:     Mouth: Mucous membranes are moist.     Pharynx: Oropharynx is clear. No oropharyngeal exudate or posterior oropharyngeal erythema.  Eyes:     General: No scleral icterus.       Right eye: No discharge.        Left eye: No discharge.     Conjunctiva/sclera: Conjunctivae normal.     Pupils: Pupils are equal, round, and reactive to light.  Neck:     Vascular: No carotid bruit.  Cardiovascular:     Rate and Rhythm: Normal rate and regular rhythm.     Pulses: Normal pulses.     Heart sounds: Normal heart sounds.  Pulmonary:     Effort: Pulmonary effort is normal. No respiratory distress.     Breath sounds: Normal breath sounds.  Abdominal:     General: Abdomen is flat. Bowel sounds are normal.     Palpations: Abdomen is soft.  Musculoskeletal:        General: Normal range of motion.     Cervical back: Normal range of motion.  Skin:    General: Skin is warm and dry.  Neurological:     General: No focal deficit present.     Mental Status: She is alert and oriented to person, place, and time. Mental status is at baseline.  Psychiatric:        Mood and Affect: Mood normal.        Behavior: Behavior normal.        Thought Content: Thought content normal.        Judgment: Judgment normal.      Impression and Plan:  Encounter for preventive health examination  Other specified hypothyroidism - Plan: TSH  Essential hypertension - Plan: CBC with Differential/Platelet, Comprehensive metabolic panel, Vitamin 123456, Vitamin D, 25-hydroxy, hydrochlorothiazide (HYDRODIURIL) 25 MG tablet  Hyperlipidemia, unspecified hyperlipidemia type - Plan: Lipid panel  Screening for malignant neoplasm of colon - Plan: Ambulatory referral to Gastroenterology  -Recommend routine eye and dental care. -Healthy lifestyle discussed in detail. -Labs to be updated today. -Prostate cancer screening:  N/A Health Maintenance  Topic Date Due   Pap Smear  01/14/2023*   COVID-19 Vaccine (6 - 2023-24 season) 07/17/2023*   Mammogram  01/14/2024*   Colon Cancer Screening  01/14/2024*   DTaP/Tdap/Td vaccine (2 - Td or Tdap) 09/27/2023   Flu Shot  Completed   Hepatitis C Screening: USPSTF Recommendation to screen - Ages 18-79 yo.  Completed   HIV Screening  Completed   Zoster (Shingles) Vaccine  Completed   HPV Vaccine  Aged Out  *Topic was postponed. The date shown is not the original due date.    -GI referral placed as she is now due for colonoscopy.     Lelon Frohlich, MD [facility] at [facility]

## 2023-01-15 ENCOUNTER — Encounter: Payer: Self-pay | Admitting: Internal Medicine

## 2023-01-27 ENCOUNTER — Telehealth: Payer: Self-pay | Admitting: Internal Medicine

## 2023-01-27 NOTE — Telephone Encounter (Signed)
Spoke with patient and reviewed lab results. 

## 2023-01-27 NOTE — Telephone Encounter (Signed)
Patient is returning Rachel's call. °

## 2023-02-03 ENCOUNTER — Ambulatory Visit (AMBULATORY_SURGERY_CENTER): Payer: No Typology Code available for payment source | Admitting: *Deleted

## 2023-02-03 ENCOUNTER — Encounter: Payer: Self-pay | Admitting: Internal Medicine

## 2023-02-03 VITALS — Ht 66.25 in | Wt 165.0 lb

## 2023-02-03 DIAGNOSIS — Z86010 Personal history of colonic polyps: Secondary | ICD-10-CM

## 2023-02-03 NOTE — Progress Notes (Signed)
Pt's pre-visit is done over the phone and all paperwork (prep instructions) sent to patient. Pt's name and DOB verified at the beginning of the pre-visit. Pt denies any difficulty with ambulating.  No egg or soy allergy known to patient  No issues known to pt with past sedation with any surgeries or procedures Pt denies having issues being intubated Pt has no issues moving head neck or swallowing No FH of Malignant Hyperthermia Pt is not on diet pills Pt is not on home 02  Pt is not on blood thinners  Pt denies issues with constipation  Pt is not on dialysis Pt denies any upcoming cardiac testing Pt encouraged to use to use Singlecare or Goodrx to reduce cost  Patient's chart reviewed by Cathlyn Parsons CNRA prior to pre-visit and patient appropriate for the [facility].  Pre-visit completed and red dot placed by patient's name on their procedure day (on provider's schedule).  . Visit by phone Pt states  weight is  Instructions reviewed with pt and pt states understanding. Instructed to review again prior to procedure. Pt states they will.  Instructions sent by mail with coupon and by my chart Pt states she has Plenvu at home.

## 2023-02-23 ENCOUNTER — Encounter: Payer: Self-pay | Admitting: Internal Medicine

## 2023-02-23 ENCOUNTER — Ambulatory Visit (AMBULATORY_SURGERY_CENTER): Payer: No Typology Code available for payment source | Admitting: Internal Medicine

## 2023-02-23 VITALS — BP 105/71 | HR 59 | Temp 97.1°F | Resp 15 | Ht 66.25 in | Wt 165.0 lb

## 2023-02-23 DIAGNOSIS — D125 Benign neoplasm of sigmoid colon: Secondary | ICD-10-CM

## 2023-02-23 DIAGNOSIS — Z09 Encounter for follow-up examination after completed treatment for conditions other than malignant neoplasm: Secondary | ICD-10-CM

## 2023-02-23 DIAGNOSIS — Z86010 Personal history of colonic polyps: Secondary | ICD-10-CM | POA: Diagnosis not present

## 2023-02-23 DIAGNOSIS — D124 Benign neoplasm of descending colon: Secondary | ICD-10-CM

## 2023-02-23 MED ORDER — SODIUM CHLORIDE 0.9 % IV SOLN: 500.0000 mL | Freq: Once | INTRAVENOUS | Status: DC

## 2023-02-23 NOTE — Op Note (Signed)
[facility] Patient Name: Chelsea Frye Procedure Date: 02/23/2023 10:13 AM MRN: [id number] Endoscopist: Beverley Fiedler , MD, [id number] Age: 57 Referring MD:  Date of Birth: [birth year] Gender: Female Account #: [id number] Procedure:                Colonoscopy Indications:              High risk colon cancer surveillance: Personal                            history of non-advanced adenoma, Last colonoscopy:                            March 2017 (incomplete, virtual colonoscopy                            thereafter showed sigmoid and ascending tortuosity                            but no polypoid lesions) Medicines:                Monitored Anesthesia Care Procedure:                Pre-Anesthesia Assessment:                           - Prior to the procedure, a History and Physical                            was performed, and patient medications and                            allergies were reviewed. The patient's tolerance of                            previous anesthesia was also reviewed. The risks                            and benefits of the procedure and the sedation                            options and risks were discussed with the patient.                            All questions were answered, and informed consent                            was obtained. Prior Anticoagulants: The patient has                            taken no anticoagulant or antiplatelet agents. ASA                            Grade Assessment: II - A patient with mild systemic  disease. After reviewing the risks and benefits,                            the patient was deemed in satisfactory condition to                            undergo the procedure.                           After obtaining informed consent, the colonoscope                            was passed under direct vision. Throughout the                            procedure, the patient's blood pressure,  pulse, and                            oxygen saturations were monitored continuously. The                            [id number]-HQ190L Colonoscope [id number] was introduced                            through the anus and advanced to the cecum,                            identified by appendiceal orifice and ileocecal                            valve. The Olympus Scope SN [id number] was introduced                            through the and advanced to the. The colonoscopy                            was somewhat difficult due to a tortuous colon.                            Successful completion of the procedure was aided by                            applying abdominal pressure. The patient tolerated                            the procedure well. The quality of the bowel                            preparation was excellent. The ileocecal valve,                            appendiceal orifice, and rectum were photographed. Scope In: 10:22:41 AM Scope Out: 10:43:04 AM Scope Withdrawal Time: 0 hours 13 minutes 52 seconds  Total Procedure  Duration: 0 hours 20 minutes 23 seconds  Findings:                 The digital rectal exam was normal.                           The left colon was moderately tortuous.                           Two sessile polyps were found in the descending                            colon. The polyps were 3 to 4 mm in size. These                            polyps were removed with a cold snare. Resection                            and retrieval were complete.                           A 9 mm polyp was found in the sigmoid colon. The                            polyp was semi-pedunculated. The polyp was removed                            with a cold snare. Resection and retrieval were                            complete.                           Internal hemorrhoids were found during                            retroflexion. The hemorrhoids were small. Complications:            No  immediate complications. Estimated Blood Loss:     Estimated blood loss was minimal. Impression:               - Tortuous colon (completion added by adult                            colonoscope and lower abdominal pressure).                           - Two 3 to 4 mm polyps in the descending colon,                            removed with a cold snare. Resected and retrieved.                           - One 9 mm polyp in the sigmoid colon, removed with  a cold snare. Resected and retrieved.                           - Small internal hemorrhoids. Recommendation:           - Patient has a contact number available for                            emergencies. The signs and symptoms of potential                            delayed complications were discussed with the                            patient. Return to normal activities tomorrow.                            Written discharge instructions were provided to the                            patient.                           - Resume previous diet.                           - Continue present medications.                           - Await pathology results.                           - Repeat colonoscopy is recommended for                            surveillance. The colonoscopy date will be                            determined after pathology results from today's                            exam become available for review. Beverley Fiedler, MD 02/23/2023 10:47:09 AM This report has been signed electronically.

## 2023-02-23 NOTE — Progress Notes (Signed)
GASTROENTEROLOGY PROCEDURE H&P NOTE   Primary Care Physician: Philip Aspen, Limmie Patricia, MD    Reason for Procedure:  History of adenomatous polyp and incomplete colonoscopy in 2017 followed by normal virtual colonoscopy  Plan:    Surveillance colonoscopy  Patient is appropriate for endoscopic procedure(s) in the ambulatory ([facility]) setting.  The nature of the procedure, as well as the risks, benefits, and alternatives were carefully and thoroughly reviewed with the patient. Ample time for discussion and questions allowed. The patient understood, was satisfied, and agreed to proceed.     HPI: Chelsea Frye is a 57 y.o. female who presents for surveillance colonoscopy.  Medical history as below.  Tolerated the prep.  No recent chest pain or shortness of breath.  No abdominal pain today.  Past Medical History:  Diagnosis Date   Anemia    history of   Cataract 2021   bilateral sx   Chicken pox    Essential hypertension 08/27/2015   on meds   Frequent headaches    Migraines   Hot flashes    Hyperlipidemia    on meds   Hypothyroidism    on meds   PONV (postoperative nausea and vomiting)    S/P laparoscopic hysterectomy 06/17/2017   Thyroid cancer (HCC) 1994   hypothyroidism, followed by endocrine   UTI (urinary tract infection)     Past Surgical History:  Procedure Laterality Date   ABDOMINAL HYSTERECTOMY N/A 06/17/2017   Procedure: POSSIBLE HYSTERECTOMY ABDOMINAL;  Surgeon: Sherian Rein, MD;  Location: [facility];  Service: Gynecology;  Laterality: N/A;   COLONOSCOPY  2017   JMP-MAC-suprep(good)-TA- tortuous colon-pt then had virtual colon   INGUINAL HERNIA REPAIR Left 2016   LAPAROSCOPIC HYSTERECTOMY N/A 06/17/2017   Procedure: HYSTERECTOMY TOTAL LAPAROSCOPIC;  Surgeon: Sherian Rein, MD;  Location: [facility];  Service: Gynecology;  Laterality: N/A;  MD needs 2.5hrs OR time   SCALP REDUCTION     less than 56 years old   THYROIDECTOMY  33   UMBILICAL  HERNIA REPAIR  2006    Prior to Admission medications   Medication Sig Start Date End Date Taking? Authorizing Provider  atorvastatin (LIPITOR) 10 MG tablet TAKE 1 TABLET BY MOUTH EVERY DAY 10/14/22  Yes Philip Aspen, Limmie Patricia, MD  COLLAGENASE CLOSTRID HISTOLYT IJ by miscellaneous route daily. Collagen hair and nail powder   Yes [provider]  hydrochlorothiazide (HYDRODIURIL) 25 MG tablet Take 1 tablet (25 mg total) by mouth daily. 01/14/23  Yes Philip Aspen, Limmie Patricia, MD  Multiple Vitamins-Minerals (ONE-A-DAY WOMENS 50 PLUS PO) Take 1 tablet by mouth daily.    Yes [provider]  Omega-3 Fatty Acids (FISH OIL) 1000 MG CAPS Take 1 capsule (1,000 mg total) by mouth 2 (two) times daily. 08/01/21  Yes Philip Aspen, Limmie Patricia, MD  PARoxetine (PAXIL) 20 MG tablet Take 1 tablet (20 mg total) by mouth daily. SCHEDULE APPT FOR FUTURE REFILLS 12/01/22  Yes Philip Aspen, Limmie Patricia, MD  SYNTHROID 200 MCG tablet Take 200 mcg by mouth daily before breakfast.  08/15/13  Yes [provider]  ZIOPTAN 0.0015 % SOLN SMARTSIG:1 Drop(s) In Eye(s) Every Evening 02/24/20  Yes [provider]    Current Outpatient Medications  Medication Sig Dispense Refill   atorvastatin (LIPITOR) 10 MG tablet TAKE 1 TABLET BY MOUTH EVERY DAY 90 tablet 1   COLLAGENASE CLOSTRID HISTOLYT IJ by miscellaneous route daily. Collagen hair and nail powder     hydrochlorothiazide (HYDRODIURIL) 25 MG tablet Take 1  tablet (25 mg total) by mouth daily. 90 tablet 1   Multiple Vitamins-Minerals (ONE-A-DAY WOMENS 50 PLUS PO) Take 1 tablet by mouth daily.      Omega-3 Fatty Acids (FISH OIL) 1000 MG CAPS Take 1 capsule (1,000 mg total) by mouth 2 (two) times daily.  0   PARoxetine (PAXIL) 20 MG tablet Take 1 tablet (20 mg total) by mouth daily. SCHEDULE APPT FOR FUTURE REFILLS 30 tablet 1   SYNTHROID 200 MCG tablet Take 200 mcg by mouth daily before breakfast.      ZIOPTAN 0.0015 % SOLN SMARTSIG:1  Drop(s) In Eye(s) Every Evening     Current Facility-Administered Medications  Medication Dose Route Frequency Provider Last Rate Last Admin   0.9 %  sodium chloride infusion  500 mL Intravenous Once Blannie Shedlock, Carie Caddy, MD        Allergies as of 02/23/2023   (No Known Allergies)    Family History  Problem Relation Age of Onset   Hypertension Mother    Hyperlipidemia Father    Hyperlipidemia Brother    Hypertension Brother    Diabetes Maternal Aunt    Diabetes Maternal Grandmother    Colon cancer Neg Hx    Colon polyps Neg Hx    Esophageal cancer Neg Hx    Rectal cancer Neg Hx    Stomach cancer Neg Hx     Social History   Socioeconomic History   Marital status: Married    Spouse name: Not on file   Number of children: Not on file   Years of education: Not on file   Highest education level: Bachelor's degree (e.g., BA, AB, BS)  Occupational History   Not on file  Tobacco Use   Smoking status: Never   Smokeless tobacco: Never  Vaping Use   Vaping Use: Never used  Substance and Sexual Activity   Alcohol use: Yes    Alcohol/week: 1.0 - 2.0 standard drink of alcohol    Types: 1 - 2 Standard drinks or equivalent per week    Comment: 1 to 2 glasses of wine weekly    Drug use: No   Sexual activity: Not on file    Comment: vasectomy  Other Topics Concern   Not on file  Social History Narrative   Work or School: works a Water quality scientist Situation: lives with husband and 4 children      Spiritual Beliefs: Catholic      Lifestyle: bikes and walks and does elliptical; diet is healthy            Social Determinants of Health   Financial Resource Strain: Low Risk  (12/10/2021)   Overall Financial Resource Strain (CARDIA)    Difficulty of Paying Living Expenses: Not hard at all  Food Insecurity: No Food Insecurity (12/10/2021)   Hunger Vital Sign    Worried About Running Out of Food in the Last Year: Never true    Ran Out of Food in the Last Year: Never true   Transportation Needs: No Transportation Needs (12/10/2021)   PRAPARE - Administrator, Civil Service (Medical): No    Lack of Transportation (Non-Medical): No  Physical Activity: Unknown (12/10/2021)   Exercise Vital Sign    Days of Exercise per Week: 3 days    Minutes of Exercise per Session: Not on file  Stress: Stress Concern Present (12/10/2021)   [employer] of Occupational Health - Occupational Stress Questionnaire    Feeling of Stress :  To some extent  Social Connections: Moderately Integrated (12/10/2021)   Social Connection and Isolation Panel [NHANES]    Frequency of Communication with Friends and Family: Three times a week    Frequency of Social Gatherings with Friends and Family: Once a week    Attends Religious Services: Never    Database administrator or Organizations: Yes    Attends Engineer, structural: More than 4 times per year    Marital Status: Married  Catering manager Violence: Not on file    Physical Exam: Vital signs in last 24 hours: @BP  104/62   Pulse 64   Temp (!) 97.1 F (36.2 C) (Skin)   Resp 14   Ht 5' 6.25" (1.683 m)   Wt 165 lb (74.8 kg)   LMP 10/20/2016 (Exact Date) Comment: gyn  SpO2 96%   BMI 26.43 kg/m  GEN: NAD EYE: Sclerae anicteric ENT: MMM CV: Non-tachycardic Pulm: CTA b/l GI: Soft, NT/ND NEURO:  Alert & Oriented x 3   Erick Blinks, MD [facility]  02/23/2023 10:47 AM

## 2023-02-23 NOTE — Progress Notes (Signed)
Called to room to assist during endoscopic procedure.  Patient ID and intended procedure confirmed with present staff. Received instructions for my participation in the procedure from the performing physician.  

## 2023-02-23 NOTE — Progress Notes (Signed)
Pt's states no medical or surgical changes since previsit or office visit. VS assessed by D.T 

## 2023-02-23 NOTE — Progress Notes (Signed)
Pt awake, alert and oriented. VSS. Airway intact. SBAR complete to RN. All questions answered.   

## 2023-02-23 NOTE — Patient Instructions (Signed)
Resume all of your previous medications today.  YOU HAD AN ENDOSCOPIC PROCEDURE TODAY AT THE [facility]:   Refer to the procedure report that was given to you for any specific questions about what was found during the examination.  If the procedure report does not answer your questions, please call your gastroenterologist to clarify.  If you requested that your care partner not be given the details of your procedure findings, then the procedure report has been included in a sealed envelope for you to review at your convenience later.  YOU SHOULD EXPECT: Some feelings of bloating in the abdomen. Passage of more gas than usual.  Walking can help get rid of the air that was put into your GI tract during the procedure and reduce the bloating. If you had a lower endoscopy (such as a colonoscopy or flexible sigmoidoscopy) you may notice spotting of blood in your stool or on the toilet paper. If you underwent a bowel prep for your procedure, you may not have a normal bowel movement for a few days.  Please Note:  You might notice some irritation and congestion in your nose or some drainage.  This is from the oxygen used during your procedure.  There is no need for concern and it should clear up in a day or so.  SYMPTOMS TO REPORT IMMEDIATELY:  Following lower endoscopy (colonoscopy or flexible sigmoidoscopy):  Excessive amounts of blood in the stool  Significant tenderness or worsening of abdominal pains  Swelling of the abdomen that is new, acute  Fever of 100F or higher   For urgent or emergent issues, a gastroenterologist can be reached at any hour by calling [phone]. Do not use MyChart messaging for urgent concerns.    DIET:  We do recommend a small meal at first, but then you may proceed to your regular diet.  Drink plenty of fluids but you should avoid alcoholic beverages for 24 hours.  ACTIVITY:  You should plan to take it easy for the rest of today and you should NOT  DRIVE or use heavy machinery until tomorrow (because of the sedation medicines used during the test).    FOLLOW UP: Our staff will call the number listed on your records the next business day following your procedure.  We will call around 7:15- 8:00 am to check on you and address any questions or concerns that you may have regarding the information given to you following your procedure. If we do not reach you, we will leave a message.     If any biopsies were taken you will be contacted by phone or by letter within the next 1-3 weeks.  Please call us at [phone] if you have not heard about the biopsies in 3 weeks.    SIGNATURES/CONFIDENTIALITY: You and/or your care partner have signed paperwork which will be entered into your electronic medical record.  These signatures attest to the fact that that the information above on your After Visit Summary has been reviewed and is understood.  Full responsibility of the confidentiality of this discharge information lies with you and/or your care-partner.

## 2023-02-24 ENCOUNTER — Telehealth: Payer: Self-pay | Admitting: *Deleted

## 2023-02-24 NOTE — Telephone Encounter (Signed)
  Follow up Call-    Row Labels 02/23/2023    9:34 AM  Call back number   Section Header. No data exists in this row.   Post procedure Call Back phone  #   [phone]  Permission to leave phone message   Yes     Patient questions:  Do you have a fever, pain , or abdominal swelling? No. Pain Score  0 *  Have you tolerated food without any problems? Yes.    Have you been able to return to your normal activities? Yes.    Do you have any questions about your discharge instructions: Diet   No. Medications  No. Follow up visit  No.  Do you have questions or concerns about your Care? No.  Actions: * If pain score is 4 or above: No action needed, pain <4.

## 2023-02-26 ENCOUNTER — Encounter: Payer: Self-pay | Admitting: Internal Medicine

## 2023-02-28 ENCOUNTER — Other Ambulatory Visit: Payer: Self-pay | Admitting: Internal Medicine

## 2023-02-28 ENCOUNTER — Encounter: Payer: Self-pay | Admitting: Internal Medicine

## 2023-03-02 MED ORDER — PAROXETINE HCL 20 MG PO TABS: 20.0000 mg | ORAL_TABLET | Freq: Every day | ORAL | 1 refills | Status: DC

## 2023-03-10 LAB — HM PAP SMEAR

## 2023-03-12 ENCOUNTER — Encounter: Payer: Self-pay | Admitting: Internal Medicine

## 2023-04-08 ENCOUNTER — Other Ambulatory Visit: Payer: Self-pay | Admitting: Internal Medicine

## 2023-04-08 DIAGNOSIS — E782 Mixed hyperlipidemia: Secondary | ICD-10-CM

## 2023-05-22 ENCOUNTER — Other Ambulatory Visit: Payer: Self-pay | Admitting: Internal Medicine

## 2023-05-22 DIAGNOSIS — I10 Essential (primary) hypertension: Secondary | ICD-10-CM

## 2023-07-06 ENCOUNTER — Other Ambulatory Visit: Payer: Self-pay | Admitting: Internal Medicine

## 2023-07-06 DIAGNOSIS — E782 Mixed hyperlipidemia: Secondary | ICD-10-CM

## 2023-08-13 ENCOUNTER — Other Ambulatory Visit: Payer: Self-pay | Admitting: Internal Medicine

## 2023-09-28 ENCOUNTER — Other Ambulatory Visit: Payer: Self-pay | Admitting: Internal Medicine

## 2023-09-28 DIAGNOSIS — I10 Essential (primary) hypertension: Secondary | ICD-10-CM

## 2024-02-10 ENCOUNTER — Other Ambulatory Visit: Payer: Self-pay | Admitting: Internal Medicine

## 2024-02-23 ENCOUNTER — Ambulatory Visit: Admitting: Internal Medicine

## 2024-03-24 ENCOUNTER — Other Ambulatory Visit: Payer: Self-pay | Admitting: Internal Medicine

## 2024-03-24 DIAGNOSIS — E782 Mixed hyperlipidemia: Secondary | ICD-10-CM

## 2024-03-24 LAB — HM MAMMOGRAPHY

## 2024-03-24 LAB — HM PAP SMEAR

## 2024-03-26 ENCOUNTER — Other Ambulatory Visit: Payer: Self-pay | Admitting: Internal Medicine

## 2024-03-26 DIAGNOSIS — I10 Essential (primary) hypertension: Secondary | ICD-10-CM

## 2024-03-29 ENCOUNTER — Encounter: Payer: Self-pay | Admitting: Internal Medicine

## 2024-05-08 ENCOUNTER — Other Ambulatory Visit: Payer: Self-pay | Admitting: Internal Medicine

## 2024-05-23 ENCOUNTER — Telehealth: Admitting: Physician Assistant

## 2024-05-23 DIAGNOSIS — S058X1A Other injuries of right eye and orbit, initial encounter: Secondary | ICD-10-CM | POA: Diagnosis not present

## 2024-05-23 MED ORDER — NEOMYCIN-POLYMYXIN-DEXAMETH 3.5-10000-0.1 OP SUSP: 1.0000 [drp] | Freq: Four times a day (QID) | OPHTHALMIC | 0 refills | Status: AC

## 2024-05-23 NOTE — Progress Notes (Signed)
 Virtual Visit Consent   Chelsea Frye, you are scheduled for a virtual visit with a [facility] provider today. Just as with appointments in the office, your consent must be obtained to participate. Your consent will be active for this visit and any virtual visit you may have with one of our providers in the next 365 days. If you have a MyChart account, a copy of this consent can be sent to you electronically.  As this is a virtual visit, video technology does not allow for your provider to perform a traditional examination. This may limit your provider's ability to fully assess your condition. If your provider identifies any concerns that need to be evaluated in person or the need to arrange testing (such as labs, EKG, etc.), we will make arrangements to do so. Although advances in technology are sophisticated, we cannot ensure that it will always work on either your end or our end. If the connection with a video visit is poor, the visit may have to be switched to a telephone visit. With either a video or telephone visit, we are not always able to ensure that we have a secure connection.  By engaging in this virtual visit, you consent to the provision of healthcare and authorize for your insurance to be billed (if applicable) for the services provided during this visit. Depending on your insurance coverage, you may receive a charge related to this service.  I need to obtain your verbal consent now. Are you willing to proceed with your visit today? [name] has provided verbal consent on 05/23/2024 for a virtual visit (video or telephone). [name], PA-C  Date: 05/23/2024 3:19 PM   Virtual Visit via Video Note   I, [name], connected with  Chelsea Frye  ([id number], 11/15/1965) on 05/23/24 at  3:15 PM EDT by a video-enabled telemedicine application and verified that I am speaking with the correct person using two identifiers.  Location: Patient: Virtual Visit Location  Patient: Home Provider: Virtual Visit Location Provider: Home Office   I discussed the limitations of evaluation and management by telemedicine and the availability of in person appointments. The patient expressed understanding and agreed to proceed.    History of Present Illness: Chelsea Frye is a 58 y.o. who identifies as a female who was assigned female at birth, and is being seen today for eye redness and irritation.  HPI: Conjunctivitis  The current episode started 3 to 5 days ago (Last week was placing mascara and accidentally touched the lateral corner of the right eye with the mascara wand). The onset was gradual. The problem occurs continuously. The problem has been unchanged. The problem is mild. Nothing (tried OTC eye drops) relieves the symptoms. Nothing aggravates the symptoms. Associated symptoms include eye pain and eye redness. Pertinent negatives include no fever, no decreased vision, no double vision, no eye itching, no photophobia, no congestion, no headaches, no URI and no eye discharge. The eye pain is mild. The eye pain is not associated with movement. The eyelid exhibits no abnormality.     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 Collagen hair and nail powder, Disp: , Rfl:    hydrochlorothiazide  (HYDRODIURIL ) 25 MG tablet, TAKE 1 TABLET DAILY, Disp: 90 tablet, Rfl: 0   Multiple Vitamins-Minerals (ONE-A-DAY WOMENS 50 PLUS PO), Take 1 tablet by mouth daily. , Disp: , Rfl:    Omega-3 Fatty Acids (FISH OIL ) 1000 MG CAPS, Take 1 capsule (1,000 mg total) by mouth 2 (two) times daily., Disp: , Rfl: 0   PARoxetine  (PAXIL ) 20 MG tablet, TAKE 1 TABLET (20 MG TOTAL) BY MOUTH DAILY. SCHEDULE APPT FOR FUTURE REFILLS, Disp: 90 tablet, Rfl: 0   SYNTHROID  200 MCG tablet, Take 200 mcg by mouth daily before breakfast. , Disp: , Rfl:    ZIOPTAN 0.0015 % SOLN, SMARTSIG:1 Drop(s) In Eye(s) Every Evening, Disp: , Rfl:   Observations/Objective: Patient is well-developed, well-nourished in no acute distress.  Resting comfortably at home.  Head is normocephalic, atraumatic.  No labored breathing.  Speech is clear and coherent with logical content.  Patient is alert and oriented at baseline.  Right eye is injected in the lateral corner; EOM grossly intact; Pupils are round and equal  Assessment and Plan: 1. Abrasion of right eye, initial encounter (Primary) - neomycin -polymyxin b-dexamethasone  (MAXITROL) 3.5-10000-0.1 SUSP; Place 1 drop into the right eye every 6 (six) hours for 5 days.  Dispense: 5 mL; Refill: 0  - Suspect corneal abrasion - Maxitrol prescribed - Warm compresses - Good hand hygiene - Seek in person evaluation if symptoms worsen or fail to improve   Follow Up Instructions: I discussed the assessment and treatment plan with the patient. The patient was provided an opportunity to ask questions and all were answered. The patient agreed with the plan and demonstrated an understanding of the instructions.  A copy of instructions were sent to the patient via MyChart unless otherwise noted below.    The patient was advised to call back or  seek an in-person evaluation if the symptoms worsen or if the condition fails to improve as anticipated.    [name], PA-C

## 2024-05-23 NOTE — Patient Instructions (Signed)
 Chelsea Frye, thank you for joining [name], PA-C for today's virtual visit.  While this provider is not your primary care provider (PCP), if your PCP is located in our provider database this encounter information will be shared with them immediately following your visit.   A [facility] MyChart account gives you access to today's visit and all your visits, tests, and labs performed at [facility]  click here if you don't have a [facility] MyChart account or go to [email or web address]  Consent: (Patient) Chelsea Frye provided verbal consent for this virtual visit at the beginning of the encounter.  Current Medications:  Current Outpatient Medications:    neomycin -polymyxin b-dexamethasone  (MAXITROL) 3.5-10000-0.1 SUSP, Place 1 drop into the right eye every 6 (six) hours for 5 days., Disp: 5 mL, Rfl: 0   atorvastatin  (LIPITOR) 10 MG tablet, TAKE 1 TABLET BY MOUTH EVERY DAY, Disp: 90 tablet, Rfl: 0   COLLAGENASE CLOSTRID HISTOLYT IJ, by miscellaneous route daily. Collagen hair and nail powder, Disp: , Rfl:    hydrochlorothiazide  (HYDRODIURIL ) 25 MG tablet, TAKE 1 TABLET DAILY, Disp: 90 tablet, Rfl: 0   Multiple Vitamins-Minerals (ONE-A-DAY WOMENS 50 PLUS PO), Take 1 tablet by mouth daily. , Disp: , Rfl:    Omega-3 Fatty Acids (FISH OIL ) 1000 MG CAPS, Take 1 capsule (1,000 mg total) by mouth 2 (two) times daily., Disp: , Rfl: 0   PARoxetine  (PAXIL ) 20 MG tablet, TAKE 1 TABLET (20 MG TOTAL) BY MOUTH DAILY. SCHEDULE APPT FOR FUTURE REFILLS, Disp: 90 tablet, Rfl: 0   SYNTHROID  200 MCG tablet, Take 200 mcg by mouth daily before breakfast. , Disp: , Rfl:    ZIOPTAN 0.0015 % SOLN, SMARTSIG:1 Drop(s) In Eye(s) Every Evening, Disp: , Rfl:    Medications ordered in this encounter:  Meds ordered this encounter  Medications   neomycin -polymyxin b-dexamethasone  (MAXITROL) 3.5-10000-0.1 SUSP    Sig: Place 1 drop into the right eye every 6 (six) hours for 5 days.    Dispense:   5 mL    Refill:  0    Supervising Provider:   BLAISE ALEENE KIDD [[id number]]     *If you need refills on other medications prior to your next appointment, please contact your pharmacy*  Follow-Up: Call back or seek an in-person evaluation if the symptoms worsen or if the condition fails to improve as anticipated.  [facility] [phone]  Other Instructions Corneal Abrasion  A corneal abrasion is a scratch or injury to the clear tissue at the front of the eye (cornea). It can be very painful. The cornea protects the eye and helps the eye focus. The cornea is made up of many layers, but the surface layer is one of the most sensitive tissues in the body. A corneal abrasion heals fast when it is treated. If it is not treated, it can become infected and cause an open sore (ulcer). This can lead to scarring, and the scarring can affect vision. Sometimes after the abrasion heals, another abrasion can come back in the same place. What are the causes? A corneal abrasion may be caused by: A poke to the eye. A gritty or irritating substance (foreign body) in the eye. Too much eye rubbing. Very dry eyes. Certain eye infections. Contact lenses that do not fit right or are worn for too long. Injury can also happen when putting in contact lenses or taking them out. Eye surgery. Certain cornea problems that make it more likely to get a corneal abrasion.  Sometimes, the cause is not known. What are the signs or symptoms? Symptoms of this condition include: Eye pain. The pain may get worse when you open, close, or move your eye. A feeling that something is poking your eye or is stuck in your eye. Tearing, redness, and sensitivity to light. Having trouble keeping your eye open, or not being able to keep it open. Blurred vision. Headache. How is this diagnosed? A corneal abrasion may be diagnosed based on your medical history, symptoms, and an eye exam. You may need to see a doctor who  specializes in conditions of the eye (ophthalmologist or optometrist). Before the eye exam, you may be given eye drops that numb the eye. You may also have dye put in your eye with a dropper or a small paper strip. This dye helps your eye doctor see your injury better when using a light or an eye scope (slit lamp). How is this treated? Treatment may vary based on what caused the corneal abrasion. It may include: Washing out your eye. Taking out anything that is stuck in your eye. Using antibiotic drops or ointment to treat or prevent an infection. Using eye drops that lessens inflammation and pain. Using steroid drops or ointment to treat redness, irritation, or inflammation. Applying a cold, wet cloth (cold compress) or ice pack to lessen the pain. Taking pain medicines. This may include over-the-counter medicines like ibuprofen . If you have a large or deep corneal abrasion, an opioid medicine may be prescribed. For large abrasions, an eye patch or a bandage soft contact lens might also be used. A bandage soft contact lens protects the cornea while it is healing. These are not used if the corneal abrasion is related to wearing contact lenses. This is because you may be more likely to get an eye infection. Follow these instructions at home: Medicines If you were prescribed eye drops or ointment, use them as told by your provider. You may be told to use: Antibiotic eye drops or ointment. Do not stop using them even if you start to feel better. Eye drops that moisten the eye (lubricating eye drops). Ask your provider if the medicine prescribed to you: Requires you to avoid driving or using machinery. Can cause constipation. You may need to take these actions to prevent or treat constipation: Drink enough fluid to keep your pee (urine) pale yellow. Take over-the-counter or prescription medicines. Eat foods that are high in fiber, such as beans, whole grains, and fresh fruits and vegetables. Limit  foods that are high in fat and processed sugars, such as fried or sweet foods. Take over-the-counter and prescription medicines only as told by your provider. Eye care Rest your eyes. Avoid bright light and overusing (straining) your eyes. Wear sunglasses. Do not rub or touch your eye. Do not wash out your eye. Ask your provider if you can use a cold compress on your eye to lessen pain. If you have an eye patch: Wear it as told by your provider. Follow your provider's instructions about when to take it off. If you have a bandage soft contact lens, your provider will take it off during your follow-up visit. Do not wear your own contact lenses until your provider says it is okay. General instructions Do not drive or use machinery as told by your provider. You may not be able to judge distances as well if your vision is affected or if you are wearing an eye patch. Keep all follow-up visits. This helps to prevent  infection and vision loss. Contact a health care provider if: You keep having eye pain and other symptoms for more than 2-3 days. You have new symptoms, such as more redness, tearing, or fluid (discharge) coming from your eye. You have swollen eyelids. Your vision gets much worse. You have discharge that makes your eyelids stick together in the morning. Your eye patch becomes so loose that you can blink your eye. Symptoms come back after your abrasion heals. Get help right away if: You have severe eye pain that does not get better with medicine. You have severe vision loss. This information is not intended to replace advice given to you by your health care provider. Make sure you discuss any questions you have with your health care provider. Document Revised: 10/30/2022 Document Reviewed: 10/30/2022 Elsevier Patient Education  2024 Elsevier Inc.   If you have been instructed to have an in-person evaluation today at a local Urgent Care facility, please use the link below. It will  take you to a list of all of our available [facility], including address, phone number and hours of operation. Please do not delay care.  [facility]  If you or a family member do not have a primary care provider, use the link below to schedule a visit and establish care. When you choose a [facility] primary care physician or advanced practice provider, you gain a long-term partner in health. Find a Primary Care Provider  Learn more about [facility] in-office and virtual care options: [facility] - Get Care Now

## 2024-06-17 ENCOUNTER — Other Ambulatory Visit: Payer: Self-pay | Admitting: Internal Medicine

## 2024-06-17 DIAGNOSIS — I10 Essential (primary) hypertension: Secondary | ICD-10-CM

## 2024-06-19 ENCOUNTER — Other Ambulatory Visit: Payer: Self-pay | Admitting: Internal Medicine

## 2024-06-19 DIAGNOSIS — E782 Mixed hyperlipidemia: Secondary | ICD-10-CM

## 2024-06-21 ENCOUNTER — Ambulatory Visit (INDEPENDENT_AMBULATORY_CARE_PROVIDER_SITE_OTHER): Admitting: Internal Medicine

## 2024-06-21 ENCOUNTER — Encounter: Payer: Self-pay | Admitting: Internal Medicine

## 2024-06-21 VITALS — BP 110/78 | HR 67 | Temp 97.7°F | Resp 16 | Ht 67.0 in | Wt 140.6 lb

## 2024-06-21 DIAGNOSIS — E038 Other specified hypothyroidism: Secondary | ICD-10-CM

## 2024-06-21 DIAGNOSIS — I10 Essential (primary) hypertension: Secondary | ICD-10-CM | POA: Diagnosis not present

## 2024-06-21 DIAGNOSIS — Z0000 Encounter for general adult medical examination without abnormal findings: Secondary | ICD-10-CM

## 2024-06-21 DIAGNOSIS — Z23 Encounter for immunization: Secondary | ICD-10-CM | POA: Diagnosis not present

## 2024-06-21 DIAGNOSIS — N951 Menopausal and female climacteric states: Secondary | ICD-10-CM

## 2024-06-21 DIAGNOSIS — E782 Mixed hyperlipidemia: Secondary | ICD-10-CM | POA: Diagnosis not present

## 2024-06-21 LAB — COMPREHENSIVE METABOLIC PANEL WITH GFR
ALT: 23 U/L (ref 0–35)
AST: 23 U/L (ref 0–37)
Albumin: 4.5 g/dL (ref 3.5–5.2)
Alkaline Phosphatase: 56 U/L (ref 39–117)
BUN: 15 mg/dL (ref 6–23)
CO2: 31 meq/L (ref 19–32)
Calcium: 9.5 mg/dL (ref 8.4–10.5)
Chloride: 99 meq/L (ref 96–112)
Creatinine, Ser: 0.83 mg/dL (ref 0.40–1.20)
GFR: 77.28 mL/min (ref >60.00)
Glucose, Bld: 99 mg/dL (ref 70–99)
Potassium: 3.5 meq/L (ref 3.5–5.1)
Sodium: 141 meq/L (ref 135–145)
Total Bilirubin: 0.6 mg/dL (ref 0.2–1.2)
Total Protein: 7.3 g/dL (ref 6.0–8.3)

## 2024-06-21 LAB — CBC WITH DIFFERENTIAL/PLATELET
Basophils Absolute: 0 K/uL (ref 0.0–0.1)
Basophils Relative: 0.6 % (ref 0.0–3.0)
Eosinophils Absolute: 0 K/uL (ref 0.0–0.7)
Eosinophils Relative: 1 % (ref 0.0–5.0)
HCT: 39.2 % (ref 36.0–46.0)
Hemoglobin: 13.3 g/dL (ref 12.0–15.0)
Lymphocytes Relative: 38.5 % (ref 12.0–46.0)
Lymphs Abs: 1.2 K/uL (ref 0.7–4.0)
MCHC: 33.8 g/dL (ref 30.0–36.0)
MCV: 85.7 fl (ref 78.0–100.0)
Monocytes Absolute: 0.2 K/uL (ref 0.1–1.0)
Monocytes Relative: 6.8 % (ref 3.0–12.0)
Neutro Abs: 1.6 K/uL (ref 1.4–7.7)
Neutrophils Relative %: 53.1 % (ref 43.0–77.0)
Platelets: 283 K/uL (ref 150.0–400.0)
RBC: 4.58 Mil/uL (ref 3.87–5.11)
RDW: 15.4 % (ref 11.5–15.5)
WBC: 3.1 K/uL — ABNORMAL LOW (ref 4.0–10.5)

## 2024-06-21 LAB — VITAMIN D 25 HYDROXY (VIT D DEFICIENCY, FRACTURES): VITD: 55.57 ng/mL (ref 30.00–100.00)

## 2024-06-21 LAB — LIPID PANEL
Cholesterol: 171 mg/dL (ref 0–200)
HDL: 64.1 mg/dL (ref >39.00)
LDL Cholesterol: 74 mg/dL (ref 0–99)
NonHDL: 107.36
Total CHOL/HDL Ratio: 3
Triglycerides: 167 mg/dL — ABNORMAL HIGH (ref 0.0–149.0)
VLDL: 33.4 mg/dL (ref 0.0–40.0)

## 2024-06-21 LAB — VITAMIN B12: Vitamin B-12: 314 pg/mL (ref 211–911)

## 2024-06-21 NOTE — Progress Notes (Signed)
 Established Patient Office Visit     CC/Reason for Visit: Annual preventive exam  HPI: Chelsea Frye is a 58 y.o. female who is coming in today for the above mentioned reasons. Past Medical History is significant for: Hypothyroidism that is followed by endocrinology, hypertension and postmenopausal vasomotor symptoms.  She is feeling very well.  She has lost a lot of weight since her previous visit.  She has routine eye and dental care.  She had her mammogram and Pap smear in June with her GYN.  She had a colonoscopy in 2024.  She is due for PCV 20 and Tdap vaccinations.   Past Medical/Surgical History: Past Medical History:  Diagnosis Date   Anemia    history of   Cataract 2021   bilateral sx   Chicken pox    Essential hypertension 08/27/2015   on meds   Frequent headaches    Migraines   Hot flashes    Hyperlipidemia    on meds   Hypothyroidism    on meds   PONV (postoperative nausea and vomiting)    S/P laparoscopic hysterectomy 06/17/2017   Thyroid  cancer (HCC) 1994   hypothyroidism, followed by endocrine   UTI (urinary tract infection)     Past Surgical History:  Procedure Laterality Date   ABDOMINAL HYSTERECTOMY N/A 06/17/2017   Procedure: POSSIBLE HYSTERECTOMY ABDOMINAL;  Surgeon: Danielle Rom, MD;  Location: [facility];  Service: Gynecology;  Laterality: N/A;   COLONOSCOPY  2017   JMP-MAC-suprep(good)-TA- tortuous colon-pt then had virtual colon   INGUINAL HERNIA REPAIR Left 2016   LAPAROSCOPIC HYSTERECTOMY N/A 06/17/2017   Procedure: HYSTERECTOMY TOTAL LAPAROSCOPIC;  Surgeon: Danielle Rom, MD;  Location: [facility];  Service: Gynecology;  Laterality: N/A;  MD needs 2.5hrs OR time   SCALP REDUCTION     less than 52 years old   THYROIDECTOMY  72   UMBILICAL HERNIA REPAIR  2006    Social History:  reports that she has never smoked. She has never used smokeless tobacco. She reports current alcohol use of about 1.0 - 2.0 standard drink of alcohol  per week. She reports that she does not use drugs.  Allergies: No Known Allergies  Family History:  Family History  Problem Relation Age of Onset   Hypertension Mother    Hyperlipidemia Father    Hyperlipidemia Brother    Hypertension Brother    Diabetes Maternal Aunt    Diabetes Maternal Grandmother    Colon cancer Neg Hx    Colon polyps Neg Hx    Esophageal cancer Neg Hx    Rectal cancer Neg Hx    Stomach cancer Neg Hx      Current Outpatient Medications:    atorvastatin  (LIPITOR) 10 MG tablet, TAKE 1 TABLET BY MOUTH EVERY DAY, Disp: 90 tablet, Rfl: 0   COLLAGENASE CLOSTRID HISTOLYT IJ, by miscellaneous route daily. Collagen hair and nail powder, Disp: , Rfl:    hydrochlorothiazide  (HYDRODIURIL ) 25 MG tablet, TAKE 1 TABLET DAILY, Disp: 30 tablet, Rfl: 0   Multiple Vitamins-Minerals (ONE-A-DAY WOMENS 50 PLUS PO), Take 1 tablet by mouth daily. , Disp: , Rfl:    Omega-3 Fatty Acids (FISH OIL ) 1000 MG CAPS, Take 1 capsule (1,000 mg total) by mouth 2 (two) times daily., Disp: , Rfl: 0   PARoxetine  (PAXIL ) 20 MG tablet, TAKE 1 TABLET (20 MG TOTAL) BY MOUTH DAILY. SCHEDULE APPT FOR FUTURE REFILLS, Disp: 90 tablet, Rfl: 0   SYNTHROID  200 MCG tablet, Take 200 mcg by mouth  daily before breakfast. , Disp: , Rfl:    ZIOPTAN 0.0015 % SOLN, SMARTSIG:1 Drop(s) In Eye(s) Every Evening, Disp: , Rfl:   Review of Systems:  Negative unless indicated in HPI.   Physical Exam: Vitals:   06/21/24 1108  BP: 110/78  Pulse: 67  Resp: 16  Temp: 97.7 F (36.5 C)  TempSrc: Oral  SpO2: 99%  Weight: 140 lb 9.6 oz (63.8 kg)  Height: 5' 7 (1.702 m)    Body mass index is 22.02 kg/m.   Physical Exam Vitals reviewed.  Constitutional:      General: She is not in acute distress.    Appearance: Normal appearance. She is not ill-appearing, toxic-appearing or diaphoretic.  HENT:     Head: Normocephalic.     Right Ear: Tympanic membrane, ear canal and external ear normal. There is no impacted  cerumen.     Left Ear: Tympanic membrane, ear canal and external ear normal. There is no impacted cerumen.     Nose: Nose normal.     Mouth/Throat:     Mouth: Mucous membranes are moist.     Pharynx: Oropharynx is clear. No oropharyngeal exudate or posterior oropharyngeal erythema.  Eyes:     General: No scleral icterus.       Right eye: No discharge.        Left eye: No discharge.     Conjunctiva/sclera: Conjunctivae normal.     Pupils: Pupils are equal, round, and reactive to light.  Neck:     Vascular: No carotid bruit.  Cardiovascular:     Rate and Rhythm: Normal rate and regular rhythm.     Pulses: Normal pulses.     Heart sounds: Normal heart sounds.  Pulmonary:     Effort: Pulmonary effort is normal. No respiratory distress.     Breath sounds: Normal breath sounds.  Abdominal:     General: Abdomen is flat. Bowel sounds are normal.     Palpations: Abdomen is soft.  Musculoskeletal:        General: Normal range of motion.     Cervical back: Normal range of motion.  Skin:    General: Skin is warm and dry.  Neurological:     General: No focal deficit present.     Mental Status: She is alert and oriented to person, place, and time. Mental status is at baseline.  Psychiatric:        Mood and Affect: Mood normal.        Behavior: Behavior normal.        Thought Content: Thought content normal.        Judgment: Judgment normal.      Impression and Plan:  Encounter for preventive health examination  Mixed hyperlipidemia -     Lipid panel; Future  Essential hypertension -     Comprehensive metabolic panel with GFR; Future -     CBC with Differential/Platelet; Future  Other specified hypothyroidism  Menopausal syndrome -     Vitamin B12; Future -     VITAMIN D  25 Hydroxy (Vit-D Deficiency, Fractures); Future    -Recommend routine eye and dental care. -Healthy lifestyle discussed in detail. -Labs to be updated today. -Prostate cancer screening: Not  applicable Health Maintenance  Topic Date Due   Pneumococcal Vaccine for age over 64 (1 of 2 - PCV) Never done   Hepatitis B Vaccine (1 of 3 - 19+ 3-dose series) Never done   Mammogram  11/10/2021   DTaP/Tdap/Td vaccine (2 - Td  or Tdap) 09/27/2023   COVID-19 Vaccine (7 - Pfizer risk 2024-25 season) 01/15/2024   Flu Shot  05/27/2024   Colon Cancer Screening  02/23/2028   Hepatitis C Screening  Completed   HIV Screening  Completed   Zoster (Shingles) Vaccine  Completed   HPV Vaccine  Aged Out   Meningitis B Vaccine  Aged Out    - Tdap and PCV 20 administered in office today.    [name], MD [facility] at [facility]

## 2024-06-21 NOTE — Progress Notes (Signed)
 Patient is in office today for a nurse visit for Pneumococcal 20 Immunization. Patient Injection was given in the  Left deltoid. Patient tolerated injection well.  Patient is in office today for a nurse visit for Tdap Immunization. Patient Injection was given in the  Right deltoid. Patient tolerated injection well.

## 2024-06-23 ENCOUNTER — Ambulatory Visit: Payer: Self-pay | Admitting: Internal Medicine

## 2024-06-27 ENCOUNTER — Encounter: Payer: Self-pay | Admitting: Internal Medicine

## 2024-06-27 DIAGNOSIS — I10 Essential (primary) hypertension: Secondary | ICD-10-CM

## 2024-06-28 MED ORDER — HYDROCHLOROTHIAZIDE 25 MG PO TABS: 25.0000 mg | ORAL_TABLET | Freq: Every day | ORAL | 1 refills | Status: AC

## 2024-08-03 ENCOUNTER — Other Ambulatory Visit: Payer: Self-pay | Admitting: Internal Medicine

## 2024-09-17 ENCOUNTER — Other Ambulatory Visit: Payer: Self-pay | Admitting: Internal Medicine

## 2024-09-17 DIAGNOSIS — E782 Mixed hyperlipidemia: Secondary | ICD-10-CM

## 2024-11-02 ENCOUNTER — Encounter: Payer: Self-pay | Admitting: Internal Medicine

## 2024-11-02 ENCOUNTER — Ambulatory Visit: Payer: Self-pay

## 2024-11-02 NOTE — Telephone Encounter (Signed)
 FYI Only or Action Required?: FYI only for provider: appointment scheduled on 1.8.26.  Patient was last seen in primary care on 06/21/2024 by Theophilus Andrews, Tully GRADE, MD.  Called Nurse Triage reporting Depression.  Symptoms began  .  Interventions attempted: Nothing.  Symptoms are: unchanged.  Triage Disposition: See Physician Within 24 Hours  Patient/caregiver understands and will follow disposition?: Yes   Copied from CRM [id number]. Topic: Clinical - Red Word Triage >> Nov 02, 2024  3:00 PM Suzen RAMAN wrote: Red Word that prompted transfer to Nurse Triage: Anxiety; requesting an appt Reason for Disposition  [1] Depression AND [2] getting worse (e.g., sleeping poorly, less able to do activities of daily living)  Answer Assessment - Initial Assessment Questions Pt states she and her husband recently separated and she is struggling with eating and sleeping. Staying busy and work helps. Denies thoughts of harming herself.    1. CONCERN: What happened that made you call today?     Difficulty eating and sleeping 2. DEPRESSION SYMPTOM SCREENING: How are you feeling overall? (e.g., decreased energy, increased sleeping or difficulty sleeping, difficulty concentrating, feelings of sadness, guilt, hopelessness, or worthlessness)     Difficulty eating/sleeping 3. RISK OF HARM - SUICIDAL IDEATION:  Do you ever have thoughts of hurting or killing yourself?  (e.g., yes, no, no but preoccupation with thoughts about death)     denies 4. RISK OF HARM - HOMICIDAL IDEATION:  Do you ever have thoughts of hurting or killing someone else?  (e.g., yes, no, no but preoccupation with thoughts about death)     denies 5. FUNCTIONAL IMPAIRMENT: How have things been going for you overall? Have you had more difficulty than usual doing your normal daily activities?  (e.g., better, same, worse; self-care, school, work, interactions)     She's able to do self car and work, not at work is when it's  worse 6. SUPPORT: Who is with you now? Who do you live with? Do you have family or friends who you can talk to?       7. THERAPIST: Do you have a counselor or therapist? If Yes, ask: What is their name?      8. STRESSORS: Has there been any new stress or recent changes in your life?     Recently separated  Protocols used: Depression-A-AH

## 2024-11-02 NOTE — Telephone Encounter (Signed)
 noted

## 2024-11-03 ENCOUNTER — Ambulatory Visit: Admitting: Internal Medicine

## 2024-11-03 ENCOUNTER — Encounter: Payer: Self-pay | Admitting: Internal Medicine

## 2024-11-03 VITALS — BP 130/88 | HR 78 | Temp 97.5°F | Wt 145.6 lb

## 2024-11-03 DIAGNOSIS — F4320 Adjustment disorder, unspecified: Secondary | ICD-10-CM | POA: Diagnosis not present

## 2024-11-03 NOTE — Progress Notes (Signed)
 "    Established Patient Office Visit     CC/Reason for Visit: Grief reaction  HPI: Chelsea Frye is a 59 y.o. female who is coming in today for the above mentioned reasons.  She and her husband just returned from a trip, unfortunately she has discovered some issues that have resulted in their separation within the past week.  She is having difficulty sleeping and eating.   Past Medical/Surgical History: Past Medical History:  Diagnosis Date   Anemia    history of   Cataract 2021   bilateral sx   Chicken pox    Essential hypertension 08/27/2015   on meds   Frequent headaches    Migraines   Hot flashes    Hyperlipidemia    on meds   Hypothyroidism    on meds   PONV (postoperative nausea and vomiting)    S/P laparoscopic hysterectomy 06/17/2017   Thyroid  cancer (HCC) 1994   hypothyroidism, followed by endocrine   UTI (urinary tract infection)     Past Surgical History:  Procedure Laterality Date   ABDOMINAL HYSTERECTOMY N/A 06/17/2017   Procedure: POSSIBLE HYSTERECTOMY ABDOMINAL;  Surgeon: Danielle Rom, MD;  Location: [facility];  Service: Gynecology;  Laterality: N/A;   COLONOSCOPY  2017   JMP-MAC-suprep(good)-TA- tortuous colon-pt then had virtual colon   INGUINAL HERNIA REPAIR Left 2016   LAPAROSCOPIC HYSTERECTOMY N/A 06/17/2017   Procedure: HYSTERECTOMY TOTAL LAPAROSCOPIC;  Surgeon: Danielle Rom, MD;  Location: [facility];  Service: Gynecology;  Laterality: N/A;  MD needs 2.5hrs OR time   SCALP REDUCTION     less than 20 years old   THYROIDECTOMY  2   UMBILICAL HERNIA REPAIR  2006    Social History:  reports that she has never smoked. She has never used smokeless tobacco. She reports current alcohol use of about 1.0 - 2.0 standard drink of alcohol per week. She reports that she does not use drugs.  Allergies: Allergies[1]  Family History:  Family History  Problem Relation Age of Onset   Hypertension Mother    Hyperlipidemia Father     Hyperlipidemia Brother    Hypertension Brother    Diabetes Maternal Aunt    Diabetes Maternal Grandmother    Colon cancer Neg Hx    Colon polyps Neg Hx    Esophageal cancer Neg Hx    Rectal cancer Neg Hx    Stomach cancer Neg Hx     Current Medications[2]  Review of Systems:  Negative unless indicated in HPI.   Physical Exam: Vitals:   11/03/24 1056  BP: 130/88  Pulse: 78  Temp: (!) 97.5 F (36.4 C)  TempSrc: Oral  SpO2: 99%  Weight: 145 lb 9.6 oz (66 kg)    Body mass index is 22.8 kg/m.    Impression and Plan:  Grief reaction  -I think it is too premature to start medication.  We discussed starting CBT and I have given her information on how to schedule.   Time spent:20 minutes reviewing chart, interviewing and examining patient and formulating plan of care.     Chelsea Theophilus Andrews, MD [facility]     [1] No Known Allergies [2]  Current Outpatient Medications:    atorvastatin  (LIPITOR) 10 MG tablet, TAKE 1 TABLET BY MOUTH EVERY DAY, Disp: 90 tablet, Rfl: 1   COLLAGENASE CLOSTRID HISTOLYT IJ, by miscellaneous route daily. Collagen hair and nail powder, Disp: , Rfl:    hydrochlorothiazide  (HYDRODIURIL ) 25 MG tablet, Take 1 tablet (25  mg total) by mouth daily., Disp: 90 tablet, Rfl: 1   Multiple Vitamins-Minerals (ONE-A-DAY WOMENS 50 PLUS PO), Take 1 tablet by mouth daily. , Disp: , Rfl:    Omega-3 Fatty Acids (FISH OIL ) 1000 MG CAPS, Take 1 capsule (1,000 mg total) by mouth 2 (two) times daily., Disp: , Rfl: 0   PARoxetine  (PAXIL ) 20 MG tablet, Take 1 tablet (20 mg total) by mouth daily., Disp: 90 tablet, Rfl: 1   SYNTHROID  200 MCG tablet, Take 200 mcg by mouth daily before breakfast. , Disp: , Rfl:    ZIOPTAN 0.0015 % SOLN, SMARTSIG:1 Drop(s) In Eye(s) Every Evening, Disp: , Rfl:   "
